# Patient Record
Sex: Female | Born: 1975
Health system: Southern US, Community
[De-identification: ages and names within clinical notes are randomized; demographics above are authoritative.]

## PROBLEM LIST (undated history)

## (undated) DIAGNOSIS — T7840XA Allergy, unspecified, initial encounter: Secondary | ICD-10-CM

## (undated) DIAGNOSIS — B20 Human immunodeficiency virus [HIV] disease: Secondary | ICD-10-CM

## (undated) DIAGNOSIS — D219 Benign neoplasm of connective and other soft tissue, unspecified: Secondary | ICD-10-CM

## (undated) DIAGNOSIS — Z21 Asymptomatic human immunodeficiency virus [HIV] infection status: Secondary | ICD-10-CM

## (undated) DIAGNOSIS — D649 Anemia, unspecified: Secondary | ICD-10-CM

## (undated) HISTORY — DX: Allergy, unspecified, initial encounter: T78.40XA

## (undated) HISTORY — DX: Anemia, unspecified: D64.9

---

## 2012-09-19 ENCOUNTER — Encounter (HOSPITAL_BASED_OUTPATIENT_CLINIC_OR_DEPARTMENT_OTHER): Payer: Self-pay | Admitting: *Deleted

## 2012-09-19 ENCOUNTER — Emergency Department (HOSPITAL_BASED_OUTPATIENT_CLINIC_OR_DEPARTMENT_OTHER): Payer: Self-pay

## 2012-09-19 ENCOUNTER — Emergency Department (HOSPITAL_BASED_OUTPATIENT_CLINIC_OR_DEPARTMENT_OTHER)
Admission: EM | Admit: 2012-09-19 | Discharge: 2012-09-19 | Disposition: A | Discharge status: Home / Self Care | Payer: Self-pay | Attending: Emergency Medicine | Admitting: Emergency Medicine

## 2012-09-19 DIAGNOSIS — M722 Plantar fascial fibromatosis: Secondary | ICD-10-CM | POA: Insufficient documentation

## 2012-09-19 DIAGNOSIS — M6281 Muscle weakness (generalized): Secondary | ICD-10-CM | POA: Insufficient documentation

## 2012-09-19 LAB — D-DIMER, QUANTITATIVE: D-Dimer, Quant: <0.27 ug/mL-FEU (ref 0.00–0.48)

## 2012-09-19 MED ORDER — IBUPROFEN 800 MG PO TABS: 800.0000 mg | ORAL_TABLET | Freq: Once | ORAL | Status: DC

## 2012-09-19 MED ORDER — IBUPROFEN 800 MG PO TABS: 800.0000 mg | ORAL_TABLET | Freq: Three times a day (TID) | ORAL | Status: DC

## 2012-09-19 NOTE — ED Provider Notes (Signed)
History     CSN: [id number]  Arrival date & time 09/19/12  1727   First MD Initiated Contact with Patient 09/19/12 1818      Chief Complaint  Patient presents with  . Leg Pain    (Consider location/radiation/quality/duration/timing/severity/associated sxs/prior treatment) Patient is a 36 y.o. female presenting with leg pain. The history is provided by the patient. No language interpreter was used.  Leg Pain  The incident occurred 2 days ago. The incident occurred at home. There was no injury mechanism. Pain location: right and left lower leg. The pain is at a severity of 5/10. The pain is moderate. The pain has been constant since onset. Associated symptoms include inability to bear weight and muscle weakness. She reports no foreign bodies present. The symptoms are aggravated by bearing weight. She has tried nothing for the symptoms.  Pt reports she has had pain in both legs for the past week. Pt reports she called her Md who told her to come in to be evaluated for blood clots in her legs.   Pt reports she has pain in both her feet.  Pt reports her arches have feel  History reviewed. No pertinent past medical history.  History reviewed. No pertinent past surgical history.  History reviewed. No pertinent family history.  History  Substance Use Topics  . Smoking status: Never Smoker   . Smokeless tobacco: Not on file  . Alcohol Use: No    OB History    Grav Para Term Preterm Abortions TAB SAB Ect Mult Living                  Review of Systems  Musculoskeletal: Negative for myalgias.  All other systems reviewed and are negative.    Allergies  Review of patient's allergies indicates no known allergies.  Home Medications  No current outpatient prescriptions on file.  BP 154/83  Pulse 112  Resp 16  Ht 5\' 4"  (1.626 m)  Wt 220 lb (99.791 kg)  BMI 37.76 kg/m2  SpO2 99%  LMP 09/05/2012  Physical Exam  Vitals reviewed. Constitutional: She is oriented to person,  place, and time. She appears well-developed and well-nourished.  Musculoskeletal: She exhibits tenderness.       Tender bilat feet,  Diffuse tenderness bilat legs up to posterior knee,  No swelling, nv and ns intact  Neurological: She is alert and oriented to person, place, and time. She has normal reflexes.  Skin: Skin is warm.  Psychiatric: She has a normal mood and affect.    ED Course  Procedures (including critical care time)   Labs Reviewed  D-DIMER, QUANTITATIVE   Dg Foot Complete Left  09/19/2012  *RADIOLOGY REPORT*  Clinical Data: Foot pain.  LEFT FOOT - COMPLETE 3+ VIEW  Comparison: None.  Findings: No evidence of fracture or dislocation.  No evidence of arthropathy.  Plantar calcaneal spur noted.  Soft tissues are unremarkable.  IMPRESSION:  1.  No acute findings. 2.  Plantar calcaneal spur.   Original Report Authenticated By: Myles Rosenthal, M.D.    Dg Foot Complete Right  09/19/2012  *RADIOLOGY REPORT*  Clinical Data: Right foot arch pain.  No known injury.  RIGHT FOOT COMPLETE - 3+ VIEW  Comparison: None.  Findings: Moderate sized inferior calcaneal spur and small posterior calcaneal spur.  Small amount of subarticular cystic change at the first MTP joint.  IMPRESSION:  1.  Calcaneal spurs. 2.  Minimal first MTP joint degenerative change.   Original Report Authenticated By: Viviann Spare  Azucena Kuba, M.D.      No diagnosis found.    MDM  ddimer negative,   Pt well's score is 1.   Bilat xrays shows heel spurs.    Pt advised to follow up with Podiatrist for evaluation,  Good supportive shoes Rx for ibuprofen       Lonia Skinner [place], [place] 09/19/12 2036

## 2012-09-19 NOTE — ED Notes (Signed)
Pt c/o bil leg pain x 2 day

## 2012-09-19 NOTE — ED Provider Notes (Signed)
Medical screening examination/treatment/procedure(s) were performed by non-physician practitioner and as supervising physician I was immediately available for consultation/collaboration.   Ebenezer Mccaskey B. Bernette Mayers, MD 09/19/12 2040

## 2016-08-02 ENCOUNTER — Emergency Department (HOSPITAL_BASED_OUTPATIENT_CLINIC_OR_DEPARTMENT_OTHER)
Admission: EM | Admit: 2016-08-02 | Discharge: 2016-08-02 | Disposition: A | Discharge status: Home / Self Care | Payer: Self-pay | Attending: Emergency Medicine | Admitting: Emergency Medicine

## 2016-08-02 ENCOUNTER — Encounter (HOSPITAL_BASED_OUTPATIENT_CLINIC_OR_DEPARTMENT_OTHER): Payer: Self-pay | Admitting: Emergency Medicine

## 2016-08-02 DIAGNOSIS — J0110 Acute frontal sinusitis, unspecified: Secondary | ICD-10-CM

## 2016-08-02 DIAGNOSIS — Z79899 Other long term (current) drug therapy: Secondary | ICD-10-CM | POA: Insufficient documentation

## 2016-08-02 DIAGNOSIS — B20 Human immunodeficiency virus [HIV] disease: Secondary | ICD-10-CM | POA: Insufficient documentation

## 2016-08-02 HISTORY — DX: Asymptomatic human immunodeficiency virus (hiv) infection status: Z21

## 2016-08-02 HISTORY — DX: Human immunodeficiency virus (HIV) disease: B20

## 2016-08-02 MED ORDER — ACETAMINOPHEN 500 MG PO TABS: 500.0000 mg | ORAL_TABLET | Freq: Four times a day (QID) | ORAL | 0 refills | Status: DC | PRN

## 2016-08-02 MED ORDER — DEXAMETHASONE 6 MG PO TABS: 10.0000 mg | ORAL_TABLET | Freq: Once | ORAL | Status: DC

## 2016-08-02 MED ORDER — CETIRIZINE HCL 10 MG PO TABS: 10.0000 mg | ORAL_TABLET | Freq: Every day | ORAL | 0 refills | Status: AC

## 2016-08-02 MED ORDER — IBUPROFEN 800 MG PO TABS
800.0000 mg | ORAL_TABLET | Freq: Once | ORAL | Status: AC
  Administered 2016-08-02: 800 mg via ORAL
  Filled 2016-08-02: qty 1

## 2016-08-02 NOTE — ED Provider Notes (Signed)
[facility] Provider Note   CSN: [id number] Arrival date & time: 08/02/16  1638 By signing my name below, I, Georgette Shell, attest that this documentation has been prepared under the direction and in the presence of Gloriann Loan, PA-C. Electronically Signed: Georgette Shell, ED Scribe. 08/02/16. 6:17 PM.  History   Chief Complaint Chief Complaint  Patient presents with  . Headache   HPI Comments: Pauli Klundt is a 40 y.o. female with h/o HIV who presents to the Emergency Department complaining of 6/10, pressurized headache radiating to her bilateral ears onset a couple days ago. Pt also has associated intermittent dizziness, mild intermittent non-productive cough, and watery eyes. She notes she has taken Claritin and Sudafed with minimal relief. Denies any recent head injury or trauma. Pt denies rhinorrhea, sneezing, numbness, paresthesias, nausea, vomiting, sore throat, fever, eye pain, double vision, blurry vision, or any other associated symptoms.   The history is provided by the patient. No language interpreter was used.    Past Medical History:  Diagnosis Date  . HIV (human immunodeficiency virus infection) ([place])     There are no active problems to display for this patient.   History reviewed. No pertinent surgical history.  OB History    No data available       Home Medications    Prior to Admission medications   Medication Sig Start Date End Date Taking? Authorizing Provider  lamiVUDine (EPIVIR) 150 MG tablet Take 150 mg by mouth 2 (two) times daily.   Yes Historical Provider, MD  nelfinavir (VIRACEPT) 625 MG tablet Take 1,250 mg by mouth 2 (two) times daily with a meal.   Yes Historical Provider, MD  tenofovir (VIREAD) 300 MG tablet Take 300 mg by mouth daily.   Yes Historical Provider, MD  acetaminophen (TYLENOL) 500 MG tablet Take 1 tablet (500 mg total) by mouth every 6 (six) hours as needed. 08/02/16   Gloriann Loan, PA-C  cetirizine (ZYRTEC) 10 MG tablet Take  1 tablet (10 mg total) by mouth daily. 08/02/16   Gloriann Loan, PA-C    Family History History reviewed. No pertinent family history.  Social History Social History  Substance Use Topics  . Smoking status: Never Smoker  . Smokeless tobacco: Never Used  . Alcohol use No     Allergies   Review of patient's allergies indicates no known allergies.   Review of Systems Review of Systems  Constitutional: Negative for fever.  HENT: Positive for congestion. Negative for rhinorrhea, sneezing and sore throat.   Eyes: Positive for discharge. Negative for pain and visual disturbance.  Respiratory: Positive for cough.   Gastrointestinal: Negative for nausea and vomiting.  Neurological: Positive for dizziness and headaches. Negative for numbness.  All other systems reviewed and are negative.    Physical Exam Updated Vital Signs BP 133/88 (BP Location: Left Arm)   Pulse 94   Temp 98.5 F (36.9 C) (Oral)   Resp 16   Ht 5\' 3"  (1.6 m)   Wt 99.3 kg   LMP 07/19/2016   SpO2 100%   BMI 38.79 kg/m   Physical Exam  Constitutional: She is oriented to person, place, and time. She appears well-developed and well-nourished. She is active.  Non-toxic appearance. She does not have a sickly appearance. She does not appear ill.  HENT:  Head: Normocephalic and atraumatic.  Right Ear: Tympanic membrane and external ear normal. Tympanic membrane is not erythematous and not bulging.  Left Ear: Tympanic membrane and external ear normal. Tympanic membrane  is not erythematous and not bulging.  Nose: No mucosal edema, rhinorrhea, nasal deformity or septal deviation. Right sinus exhibits frontal sinus tenderness. Right sinus exhibits no maxillary sinus tenderness. Left sinus exhibits frontal sinus tenderness. Left sinus exhibits no maxillary sinus tenderness.  Mouth/Throat: Uvula is midline, oropharynx is clear and moist and mucous membranes are normal. No trismus in the jaw. No uvula swelling. No  oropharyngeal exudate, posterior oropharyngeal edema, posterior oropharyngeal erythema or tonsillar abscesses.  Neck: Normal range of motion. Neck supple.  No nuchal rigidity.   Cardiovascular: Normal rate and regular rhythm.   Pulmonary/Chest: Effort normal and breath sounds normal. No respiratory distress. She has no wheezes. She has no rales.  Abdominal: Soft. Bowel sounds are normal. She exhibits no distension. There is no tenderness.  Musculoskeletal: Normal range of motion.  Lymphadenopathy:    She has no cervical adenopathy.  Neurological: She is alert and oriented to person, place, and time.  Mental Status:   AOx3.  Speech clear without dysarthria. Cranial Nerves:  I-not tested  II-PERRLA  III, IV, VI-EOMs intact  V-temporal and masseter strength intact  VII-symmetrical facial movements intact, no facial droop  VIII-hearing grossly intact bilaterally  IX, X-gag intact  XI-strength of sternomastoid and trapezius muscles 5/5  XII-tongue midline Motor:   Good muscle bulk and tone  Strength 5/5 bilaterally in upper and lower extremities   Cerebellar--intact RAMs, finger to nose intact bilaterally.  Gait normal  No pronator drift Sensory:  Intact in upper and lower extremities   Skin: Skin is warm and dry.  Psychiatric: She has a normal mood and affect. Her behavior is normal.     ED Treatments / Results  DIAGNOSTIC STUDIES: Oxygen Saturation is 100% on RA, normal by my interpretation.    COORDINATION OF CARE: 6:16 PM Discussed treatment plan with pt at bedside which includes Ibuprofen and Zyrtec and pt agreed to plan.  Labs (all labs ordered are listed, but only abnormal results are displayed) Labs Reviewed - No data to display  EKG  EKG Interpretation None       Radiology No results found.  Procedures Procedures (including critical care time)  Medications Ordered in ED Medications  ibuprofen (ADVIL,MOTRIN) tablet 800 mg (800 mg Oral Given 08/02/16  1834)     Initial Impression / Assessment and Plan / ED Course  I have reviewed the triage vital signs and the nursing notes.  Pertinent labs & imaging results that were available during my care of the patient were reviewed by me and considered in my medical decision making (see chart for details).  Clinical Course   Patient complaining of symptoms of sinusitis.    Mild to moderate symptoms of clear/yellow nasal discharge/congestion and scratchy throat with cough for less than 10 days.  Patient is afebrile.  Normal neurological exam. No concern for acute bacterial rhinosinusitis; likely viral in nature.  No concern for abscess, meningitis, or concerning neurologic etiology.  Patient discharged with symptomatic treatment.  Patient instructions given for warm saline nasal washes.  Recommendations for follow-up with primary care physician.     Final Clinical Impressions(s) / ED Diagnoses   Final diagnoses:  Acute non-recurrent frontal sinusitis    New Prescriptions New Prescriptions   ACETAMINOPHEN (TYLENOL) 500 MG TABLET    Take 1 tablet (500 mg total) by mouth every 6 (six) hours as needed.   CETIRIZINE (ZYRTEC) 10 MG TABLET    Take 1 tablet (10 mg total) by mouth daily.   I personally  performed the services described in this documentation, which was scribed in my presence. The recorded information has been reviewed and is accurate.     Gloriann Loan, PA-C 08/02/16 [place] Liu, MD 08/03/16 1322

## 2016-08-02 NOTE — ED Triage Notes (Signed)
Patient states that she has a lot of ear pressure and sometimes dizziness - the patient states that it is worse when she drives.

## 2016-08-24 ENCOUNTER — Encounter (HOSPITAL_BASED_OUTPATIENT_CLINIC_OR_DEPARTMENT_OTHER): Payer: Self-pay | Admitting: *Deleted

## 2016-08-24 ENCOUNTER — Emergency Department (HOSPITAL_BASED_OUTPATIENT_CLINIC_OR_DEPARTMENT_OTHER)
Admission: EM | Admit: 2016-08-24 | Discharge: 2016-08-24 | Disposition: A | Discharge status: Home / Self Care | Payer: Self-pay | Attending: Emergency Medicine | Admitting: Emergency Medicine

## 2016-08-24 DIAGNOSIS — H6690 Otitis media, unspecified, unspecified ear: Secondary | ICD-10-CM

## 2016-08-24 DIAGNOSIS — Z79899 Other long term (current) drug therapy: Secondary | ICD-10-CM | POA: Insufficient documentation

## 2016-08-24 DIAGNOSIS — H9202 Otalgia, left ear: Secondary | ICD-10-CM

## 2016-08-24 DIAGNOSIS — H6692 Otitis media, unspecified, left ear: Secondary | ICD-10-CM | POA: Insufficient documentation

## 2016-08-24 DIAGNOSIS — Z21 Asymptomatic human immunodeficiency virus [HIV] infection status: Secondary | ICD-10-CM | POA: Insufficient documentation

## 2016-08-24 DIAGNOSIS — Z79891 Long term (current) use of opiate analgesic: Secondary | ICD-10-CM | POA: Insufficient documentation

## 2016-08-24 MED ORDER — AMOXICILLIN 500 MG PO CAPS: 500.0000 mg | ORAL_CAPSULE | Freq: Three times a day (TID) | ORAL | 0 refills | Status: DC

## 2016-08-24 MED FILL — AMOXICILLIN 500 MG CAPSULE: 500 | 7 days supply | Qty: 21 | Fill #0

## 2016-08-24 NOTE — ED Triage Notes (Signed)
Pt reports continued left ear pain x 3 weeks. Now right ear also is hurting. C/o dizziness since Saturday. Has been taking zyrtec and tylenol

## 2016-08-24 NOTE — ED Provider Notes (Signed)
[facility] [facility] Provider Note   CSN: [id number] Arrival date & time: 08/24/16  [id number]     History   Chief Complaint Chief Complaint  Patient presents with  . Otalgia    HPI Melissa White is a 40 y.o. female.  HPI Patient presents to the emergency department with complaints of acute left ear pain over the past several days.  She's had pain there for several weeks but reports that it is worsening.  She had some dizziness yesterday described as a sensation of spinning.  She denies fevers and chills.  She reports the majority of her upper respiratory symptoms have resolved but her left ear pain has continued.  She denies dental pain.  No difficulty breathing or swallowing.     Past Medical History:  Diagnosis Date  . HIV (human immunodeficiency virus infection) ([place])     There are no active problems to display for this patient.   History reviewed. No pertinent surgical history.  OB History    No data available       Home Medications    Prior to Admission medications   Medication Sig Start Date End Date Taking? Authorizing Provider  acetaminophen (TYLENOL) 500 MG tablet Take 1 tablet (500 mg total) by mouth every 6 (six) hours as needed. 08/02/16  Yes Kayla Rose, PA-C  cetirizine (ZYRTEC) 10 MG tablet Take 1 tablet (10 mg total) by mouth daily. 08/02/16  Yes Kayla Rose, PA-C  lamiVUDine (EPIVIR) 150 MG tablet Take 150 mg by mouth 2 (two) times daily.   Yes Historical Provider, MD  nelfinavir (VIRACEPT) 625 MG tablet Take 1,250 mg by mouth 2 (two) times daily with a meal.   Yes Historical Provider, MD  tenofovir (VIREAD) 300 MG tablet Take 300 mg by mouth daily.   Yes Historical Provider, MD  amoxicillin (AMOXIL) 500 MG capsule Take 1 capsule (500 mg total) by mouth 3 (three) times daily. 08/24/16   Jola Schmidt, MD    Family History No family history on file.  Social History Social History  Substance Use Topics  . Smoking status: Never Smoker  . Smokeless  tobacco: Never Used  . Alcohol use No     Allergies   Patient has no known allergies.   Review of Systems Review of Systems  All other systems reviewed and are negative.    Physical Exam Updated Vital Signs BP 130/78 (BP Location: Left Arm)   Pulse 87   Temp 98.1 F (36.7 C) (Oral)   Resp 18   Ht 5\' 3"  (1.6 m)   Wt 220 lb (99.8 kg)   LMP 08/18/2016   SpO2 99%   BMI 38.97 kg/m   Physical Exam  Constitutional: She is oriented to person, place, and time. She appears well-developed and well-nourished.  HENT:  Head: Normocephalic.  Right TM normal.  Anterior neck normal.  Dentition normal.  Left TM with bulging and mild erythema  Eyes: EOM are normal.  Neck: Normal range of motion.  Pulmonary/Chest: Effort normal.  Abdominal: She exhibits no distension.  Musculoskeletal: Normal range of motion.  Neurological: She is alert and oriented to person, place, and time.  Psychiatric: She has a normal mood and affect.  Nursing note and vitals reviewed.    ED Treatments / Results  Labs (all labs ordered are listed, but only abnormal results are displayed) Labs Reviewed - No data to display  EKG  EKG Interpretation None       Radiology No results found.  Procedures  Procedures (including critical care time)  Medications Ordered in ED Medications - No data to display   Initial Impression / Assessment and Plan / ED Course  I have reviewed the triage vital signs and the nursing notes.  Pertinent labs & imaging results that were available during my care of the patient were reviewed by me and considered in my medical decision making (see chart for details).  Clinical Course     Patient be treated for developing left-sided acute otitis media.  Overall well-appearing.  Nontender mastoid.  Final Clinical Impressions(s) / ED Diagnoses   Final diagnoses:  Otalgia of left ear  Acute otitis media, unspecified otitis media type    New Prescriptions New  Prescriptions   AMOXICILLIN (AMOXIL) 500 MG CAPSULE    Take 1 capsule (500 mg total) by mouth 3 (three) times daily.     Jola Schmidt, MD 08/24/16 [phone]

## 2017-03-15 ENCOUNTER — Encounter (HOSPITAL_BASED_OUTPATIENT_CLINIC_OR_DEPARTMENT_OTHER): Payer: Self-pay | Admitting: *Deleted

## 2017-03-15 ENCOUNTER — Emergency Department (HOSPITAL_BASED_OUTPATIENT_CLINIC_OR_DEPARTMENT_OTHER)
Admission: EM | Admit: 2017-03-15 | Discharge: 2017-03-15 | Disposition: A | Discharge status: Home / Self Care | Payer: BLUE CROSS/BLUE SHIELD | Attending: Emergency Medicine | Admitting: Emergency Medicine

## 2017-03-15 DIAGNOSIS — R21 Rash and other nonspecific skin eruption: Secondary | ICD-10-CM | POA: Diagnosis not present

## 2017-03-15 DIAGNOSIS — L259 Unspecified contact dermatitis, unspecified cause: Secondary | ICD-10-CM | POA: Diagnosis not present

## 2017-03-15 DIAGNOSIS — Z79899 Other long term (current) drug therapy: Secondary | ICD-10-CM | POA: Insufficient documentation

## 2017-03-15 DIAGNOSIS — B20 Human immunodeficiency virus [HIV] disease: Secondary | ICD-10-CM | POA: Diagnosis not present

## 2017-03-15 MED ORDER — HYDROCORTISONE 1 % EX CREA: TOPICAL_CREAM | CUTANEOUS | 0 refills | Status: DC

## 2017-03-15 MED ORDER — DIPHENHYDRAMINE HCL 25 MG PO CAPS: 25.0000 mg | ORAL_CAPSULE | Freq: Four times a day (QID) | ORAL | 0 refills | Status: DC | PRN

## 2017-03-15 NOTE — ED Provider Notes (Signed)
[facility] Provider Note   CSN: [id number] Arrival date & time: 03/15/17  0354     History   Chief Complaint Chief Complaint  Patient presents with  . Rash    HPI Melissa White is a 41 y.o. female  With history of HIV wo presents today with chief complaint acute onset, progressively worsening pruritic rash of the face and neck which began 2 days ago. She states she noticed the rash on her neck initially and has spread to bilateral jawline radiating up to the forehead and behind the ears. She has tried Benadryl cream without relief. She states it is not painful, just itchy. She states that she started using a new Dove soap body wash recently, as well as a new hair spray, but denies any other new exposures. No modifying factors. Denies known insect bites. No one else around her has a similar rash. S she denies bleeding or drainage. he denies fever, chills, chest pain, shortness of breath, arthralgias, headaches. The history is provided by the patient.    Past Medical History:  Diagnosis Date  . HIV (human immunodeficiency virus infection) ([place])     There are no active problems to display for this patient.   History reviewed. No pertinent surgical history.  OB History    No data available       Home Medications    Prior to Admission medications   Medication Sig Start Date End Date Taking? Authorizing Provider  acetaminophen (TYLENOL) 500 MG tablet Take 1 tablet (500 mg total) by mouth every 6 (six) hours as needed. 08/02/16   Gloriann Loan, PA-C  cetirizine (ZYRTEC) 10 MG tablet Take 1 tablet (10 mg total) by mouth daily. 08/02/16   Gloriann Loan, PA-C  diphenhydrAMINE (BENADRYL) 25 mg capsule Take 1 capsule (25 mg total) by mouth every 6 (six) hours as needed for itching. 03/15/17   Nils Flack, Shaylinn Hladik A, PA-C  hydrocortisone cream 1 % Apply to affected area 2 times daily 03/15/17   Rodell Perna A, PA-C  lamiVUDine (EPIVIR) 150 MG tablet Take 150 mg by mouth 2 (two) times  daily.    [provider]  nelfinavir (VIRACEPT) 625 MG tablet Take 1,250 mg by mouth 2 (two) times daily with a meal.    [provider]  tenofovir (VIREAD) 300 MG tablet Take 300 mg by mouth daily.    [provider]    Family History History reviewed. No pertinent family history.  Social History Social History  Substance Use Topics  . Smoking status: Never Smoker  . Smokeless tobacco: Never Used  . Alcohol use No     Allergies   Patient has no known allergies.   Review of Systems Review of Systems  Constitutional: Negative for chills and fever.  Respiratory: Negative for shortness of breath.   Cardiovascular: Negative for chest pain.  Gastrointestinal: Negative for abdominal pain.  Musculoskeletal: Negative for arthralgias and myalgias.  Skin: Positive for rash.  Neurological: Negative for headaches.  All other systems reviewed and are negative.    Physical Exam Updated Vital Signs BP (!) 144/92 (BP Location: Left Arm)   Pulse 82   Temp 98.7 F (37.1 C) (Oral)   Resp 18   Ht 5\' 4"  (1.626 m)   Wt 100.2 kg (221 lb)   SpO2 100%   BMI 37.93 kg/m   Physical Exam  Constitutional: She appears well-developed and well-nourished.  HENT:  White: Normocephalic and atraumatic.  Right Ear: External ear normal.  Left  Ear: External ear normal.  Mouth/Throat: Oropharynx is clear and moist. No oropharyngeal exudate.  TMs normal bilaterally, posterior oropharynx clear. Airway patent.  Eyes: Conjunctivae are normal. Right eye exhibits no discharge. Left eye exhibits no discharge. No scleral icterus.  Neck: Normal range of motion. Neck supple. No JVD present. No tracheal deviation present.  Cardiovascular: Normal rate.   Pulmonary/Chest: Effort normal.  Abdominal: She exhibits no distension.  Musculoskeletal: She exhibits no edema.  Lymphadenopathy:    She has no cervical adenopathy.  Neurological: She is alert.  Skin: Skin is warm and dry. Rash  noted.  Multiple small (<63mm) raised mildly erythematous circular lesions to the neck radiating up to the jawline, cheeks, temples. No pustules, vesicles, bullae, tracts or desquamation.  Psychiatric: She has a normal mood and affect. Her behavior is normal.     ED Treatments / Results  Labs (all labs ordered are listed, but only abnormal results are displayed) Labs Reviewed - No data to display  EKG  EKG Interpretation None       Radiology No results found.  Procedures Procedures (including critical care time)  Medications Ordered in ED Medications - No data to display   Initial Impression / Assessment and Plan / ED Course  I have reviewed the triage vital signs and the nursing notes.  Pertinent labs & imaging results that were available during my care of the patient were reviewed by me and considered in my medical decision making (see chart for details).     Rash consistent with contact dermatitis from exposure to new soaps/hair product. Patient denies any difficulty breathing or swallowing.  Pt has a patent airway without stridor and is handling secretions without difficulty; no angioedema. No blisters, no pustules, no warmth, no draining sinus tracts, no superficial abscesses, no bullous impetigo, no vesicles, no desquamation, no target lesions with dusky purpura or a central bulla. Not tender to touch. No concern for superimposed infection. No concern for SJS, TEN, TSS, tick borne illness, syphilis or other life-threatening condition. Will discharge home with short course of low potency topical steroids and recommend Benadryl as needed for pruritis. She will follow up with her primary care or dermatology for reevaluation. Discussed indications for return to the ED. Pt verbalized understanding of and agreement with plan and is safe for discharge home at this time.  Final Clinical Impressions(s) / ED Diagnoses   Final diagnoses:  Rash    New Prescriptions New  Prescriptions   DIPHENHYDRAMINE (BENADRYL) 25 MG CAPSULE    Take 1 capsule (25 mg total) by mouth every 6 (six) hours as needed for itching.   HYDROCORTISONE CREAM 1 %    Apply to affected area 2 times daily     Debroah Baller 03/15/17 0955    Davonna Belling, MD 03/15/17 [phone]

## 2017-03-15 NOTE — ED Triage Notes (Signed)
Rash to face and neck x 1 day.  Itching.

## 2017-03-15 NOTE — Discharge Instructions (Signed)
Apply steroid cream to the neck twice daily, take Benadryl as needed for itching. Follow-up with your primary care or dermatology for reevaluation. Return to the ED if any concerning symptoms develop.

## 2017-05-24 DIAGNOSIS — Z124 Encounter for screening for malignant neoplasm of cervix: Secondary | ICD-10-CM | POA: Diagnosis not present

## 2017-05-24 DIAGNOSIS — Z0000 Encounter for general adult medical examination without abnormal findings: Secondary | ICD-10-CM | POA: Diagnosis not present

## 2017-05-24 DIAGNOSIS — R87618 Other abnormal cytological findings on specimens from cervix uteri: Secondary | ICD-10-CM | POA: Diagnosis not present

## 2017-05-24 DIAGNOSIS — Z21 Asymptomatic human immunodeficiency virus [HIV] infection status: Secondary | ICD-10-CM | POA: Diagnosis not present

## 2017-05-24 DIAGNOSIS — Z3009 Encounter for other general counseling and advice on contraception: Secondary | ICD-10-CM | POA: Diagnosis not present

## 2017-05-24 DIAGNOSIS — R102 Pelvic and perineal pain: Secondary | ICD-10-CM | POA: Diagnosis not present

## 2017-05-28 DIAGNOSIS — Z6839 Body mass index (BMI) 39.0-39.9, adult: Secondary | ICD-10-CM | POA: Diagnosis not present

## 2017-05-28 DIAGNOSIS — D509 Iron deficiency anemia, unspecified: Secondary | ICD-10-CM | POA: Diagnosis not present

## 2017-05-28 DIAGNOSIS — B20 Human immunodeficiency virus [HIV] disease: Secondary | ICD-10-CM | POA: Diagnosis not present

## 2017-05-28 DIAGNOSIS — E669 Obesity, unspecified: Secondary | ICD-10-CM | POA: Diagnosis not present

## 2017-06-15 NOTE — Progress Notes (Signed)
[place] at [facility] [place], [place], [place] [address] [phone]  Date:  06/16/2017   Name:  Milli Woolridge   DOB:  1976/08/08   MRN:  [id number]  PCP:  Darreld Mclean, MD    Chief Complaint: Establish Care (PATIENT IS HIV POSITIVE)   History of Present Illness:  Adelyn Roscher is a 41 y.o. 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 Her sons are 103, 29 and 51 yo, and she has a Geographical information systems officer who is 41 yo She is married  She stays busy working and spending time with her family She plans to get a mammogram soon- has not done yet  She is otherwise generally in good health   About 3 weeks ago she saw her GYN and had her pap  She does tend to have heavy menses  Her GYN gave her some ibuprofen for menstrual pains  She takes iron pills for anemia It seems that her pharmacist was worried about her using ibuprofen (per her GYN- 800 mg) with her HIV meds due to concern about nephrotoxicity so she did not get this filled.  She wonders if ok for her to use ibuprofen  She did have her cholesterol checked recently per her ID clinic  She reports that they also keep an eye on her blood sugar  Patient Active Problem List   Diagnosis Date Noted  . HIV positive ([place]) 06/16/2017  . Menstrual cramps 06/16/2017    Past Medical History:  Diagnosis Date  . Allergy   . Anemia   . HIV (human immunodeficiency virus infection) ([place])     Past Surgical History:  Procedure Laterality Date  . CESAREAN SECTION     2    Social History  Substance Use Topics  . Smoking  status: Never Smoker  . Smokeless tobacco: Never Used  . Alcohol use No    Family History  Problem Relation Age of Onset  . Diabetes Mother   . Arthritis Mother   . Hypertension Mother   . Kidney disease Father   . Hypertension Father     No Known Allergies  Medication list has been reviewed and updated.  Current Outpatient Prescriptions on File Prior to Visit  Medication Sig Dispense Refill  . acetaminophen (TYLENOL) 500 MG tablet Take 1 tablet (500 mg total) by mouth every 6 (six) hours as needed. 30 tablet 0  . cetirizine (ZYRTEC) 10 MG tablet Take 1 tablet (10 mg total) by mouth daily. 30 tablet 0  . diphenhydrAMINE (BENADRYL) 25 mg capsule Take 1 capsule (25 mg total) by mouth every 6 (six) hours as needed for itching. 30 capsule 0  . hydrocortisone cream 1 % Apply to affected area 2 times daily 15 g 0  . lamiVUDine (EPIVIR) 150 MG tablet Take 150 mg by mouth 2 (two) times daily.    Marland Kitchen nelfinavir (VIRACEPT) 625 MG tablet Take 1,250 mg by mouth 2 (two) times daily with a meal.    . tenofovir (VIREAD) 300 MG tablet Take 300 mg  by mouth daily.     No current facility-administered medications on file prior to visit.     Review of Systems:  As per HPI- otherwise negative. No fever, chills, nausea, vomiting, CP, SOB, ST, cough   Physical Examination: Vitals:   06/16/17 1318  BP: 126/80  Pulse: (!) 101  Temp: 98.3 F (36.8 C)  SpO2: 99%   Vitals:   06/16/17 1318  Weight: 224 lb 3.2 oz (101.7 kg)  Height: 5' 3.2" (1.605 m)   Body mass index is 39.46 kg/m. Ideal Body Weight: Weight in (lb) to have BMI = 25: 141.7  GEN: WDWN, NAD, Non-toxic, A & O x 3, obese, otherwise looks well HEENT: Atraumatic, Normocephalic. Neck supple. No masses, No LAD.  Bilateral TM wnl, oropharynx normal.  PEERL,EOMI.   Ears and Nose: No external deformity. CV: RRR, No M/G/R. No JVD. No thrill. No extra heart sounds. PULM: CTA B, no wheezes, crackles, rhonchi. No retractions. No resp.  distress. No accessory muscle use. EXTR: No c/c/e NEURO Normal gait.  PSYCH: Normally interactive. Conversant. Not depressed or anxious appearing.  Calm demeanor.    Assessment and Plan: Menstrual cramps  HIV positive ([place])  Checked 2 resources- eporcates has nothing, but UTD mentions risk of nephrotoxicity with high dose NSAIDs and Viread.  She will try lower dose otc ibuprofen for her cramps, will let me know if not helpful Otherwise she does not have any acute needs today  Signed Lamar Blinks, MD

## 2017-06-16 ENCOUNTER — Encounter: Payer: Self-pay | Admitting: Family Medicine

## 2017-06-16 ENCOUNTER — Ambulatory Visit (INDEPENDENT_AMBULATORY_CARE_PROVIDER_SITE_OTHER): Payer: BLUE CROSS/BLUE SHIELD | Admitting: Family Medicine

## 2017-06-16 VITALS — BP 126/80 | HR 101 | Temp 98.3°F | Ht 63.2 in | Wt 224.2 lb

## 2017-06-16 DIAGNOSIS — N946 Dysmenorrhea, unspecified: Secondary | ICD-10-CM | POA: Diagnosis not present

## 2017-06-16 DIAGNOSIS — Z21 Asymptomatic human immunodeficiency virus [HIV] infection status: Secondary | ICD-10-CM | POA: Insufficient documentation

## 2017-06-16 NOTE — Patient Instructions (Addendum)
Why don't you try 200 to 400 mg of ibuprefen as needed for your menstrual pains - the 800 mg might be risky in combination with viread.    It was very nice to see you today- take care and let me know if you need anything!

## 2017-11-09 DIAGNOSIS — Z79899 Other long term (current) drug therapy: Secondary | ICD-10-CM | POA: Diagnosis not present

## 2017-11-09 DIAGNOSIS — M545 Low back pain: Secondary | ICD-10-CM | POA: Diagnosis not present

## 2017-11-09 DIAGNOSIS — E669 Obesity, unspecified: Secondary | ICD-10-CM | POA: Diagnosis not present

## 2017-11-09 DIAGNOSIS — B20 Human immunodeficiency virus [HIV] disease: Secondary | ICD-10-CM | POA: Diagnosis not present

## 2017-12-17 DIAGNOSIS — B20 Human immunodeficiency virus [HIV] disease: Secondary | ICD-10-CM | POA: Diagnosis not present

## 2018-01-11 NOTE — Progress Notes (Signed)
[place] at [facility] [place], [place], [place] [address] [phone]  Date:  01/12/2018   Name:  Melissa White   DOB:  04-05-1976   MRN:  [id number]  PCP:  Darreld Mclean, MD    Chief Complaint: Shoulder Pain (c/o right shoulder pain that has been present for a couple of weeks )   History of Present Illness:  Melissa White is a 41 y.o. 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 Her sons are 74, 71 and 66 yo, and she has a Geographical information systems officer who is 42 yo She is married  She stays busy working and spending time with her family  She has noted right shoulder pain for a couple of weeks She is not really aware of any injury It is more painful when she first gets up in the morning  She is a CNA, 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  She did have a lot of problems with her ears as a child   She is now down to a single HIV pill as she was doing so well with her treatment- so far she is tolerating this quite well   Patient Active Problem List   Diagnosis Date Noted  . HIV positive ([place]) 06/16/2017  . Menstrual cramps 06/16/2017    Past Medical History:  Diagnosis Date  . Allergy   . Anemia   . HIV (human immunodeficiency virus infection) ([place])     Past  Surgical History:  Procedure Laterality Date  . CESAREAN SECTION     2    Social History   Tobacco Use  . Smoking status: Never Smoker  . Smokeless tobacco: Never Used  Substance Use Topics  . Alcohol use: No  . Drug use: No    Family History  Problem Relation Age of Onset  . Diabetes Mother   . Arthritis Mother   . Hypertension Mother   . Kidney disease Father   . Hypertension Father     No Known Allergies  Medication list has been reviewed and updated.  Current Outpatient Medications on File Prior to Visit  Medication Sig Dispense Refill  . BIKTARVY 50-200-25 MG TABS tablet TK 1 T PO  QD  5  . acetaminophen (TYLENOL) 500 MG tablet Take 1 tablet (500 mg total) by mouth every 6 (six) hours as needed. 30 tablet 0  . cetirizine (ZYRTEC) 10 MG tablet Take 1 tablet (10 mg total) by mouth daily. 30 tablet 0  . diphenhydrAMINE (BENADRYL) 25 mg capsule Take 1 capsule (25 mg total) by mouth every 6 (six) hours as needed for itching. 30 capsule 0  . hydrocortisone cream 1 % Apply to  affected area 2 times daily 15 g 0   No current facility-administered medications on file prior to visit.     Review of Systems:  As per HPI- otherwise negative.   Physical Examination: Vitals:   01/12/18 1001  BP: 132/82  Pulse: 82  Temp: 97.9 F (36.6 C)  SpO2: 97%   Vitals:   01/12/18 1001  Weight: 232 lb 12.8 oz (105.6 kg)  Height: 5\' 3"  (1.6 m)   Body mass index is 41.24 kg/m. Ideal Body Weight: Weight in (lb) to have BMI = 25: 140.8  GEN: WDWN, NAD, Non-toxic, A & O x 3, obese, looks well  HEENT: Atraumatic, Normocephalic. Neck supple. No masses, No LAD.  Bilateral TM wnl, oropharynx normal.  PEERL,EOMI.   Ears and Nose: No external deformity. CV: RRR, No M/G/R. No JVD. No thrill. No extra heart sounds. PULM: CTA B, no wheezes, crackles, rhonchi. No retractions. No resp. distress. No accessory muscle use. ABD: S, NT, ND, +BS. No rebound. No HSM. EXTR: No c/c/e NEURO  Normal gait.  PSYCH: Normally interactive. Conversant. Not depressed or anxious appearing.  Calm demeanor.  She has small external ears and narrow ear canal, small cavus.  However otherwise no abnl noted Right shoulder: full abduction, full flexion but she has pain at about 150 degrees. Normal external rotation, some pain with internal rotation Normal shoulder/ RUE strength to all testing performed, normal biceps DTR   Assessment and Plan: Right rotator cuff tendonitis  Drainage from right ear - Plan: acetic acid-hydrocortisone (VOSOL-HC) OTIC solution  Will have her try ear drops as above Suspect RC tendonitis but not tear.  Recommended that she do PT.  She would like to try home PT first, and will let me know if not helpful to her.  Gave her a hand-out and went over some exercises together for her   See patient instructions for more details.     Signed Lamar Blinks, MD

## 2018-01-12 ENCOUNTER — Ambulatory Visit (INDEPENDENT_AMBULATORY_CARE_PROVIDER_SITE_OTHER): Payer: BLUE CROSS/BLUE SHIELD | Admitting: Family Medicine

## 2018-01-12 ENCOUNTER — Encounter: Payer: Self-pay | Admitting: Family Medicine

## 2018-01-12 VITALS — BP 132/82 | HR 82 | Temp 97.9°F | Ht 63.0 in | Wt 232.8 lb

## 2018-01-12 DIAGNOSIS — H9211 Otorrhea, right ear: Secondary | ICD-10-CM

## 2018-01-12 DIAGNOSIS — M7581 Other shoulder lesions, right shoulder: Secondary | ICD-10-CM

## 2018-01-12 MED ORDER — HYDROCORTISONE-ACETIC ACID 1-2 % OT SOLN: 3.0000 [drp] | Freq: Three times a day (TID) | OTIC | 0 refills | Status: DC

## 2018-01-12 NOTE — Patient Instructions (Signed)
It seems that you have rotator cuff tendonitis in your right shoulder.  Try some of the exercises that we went over today. However, if you are not improved you may benefit from formal physical therapy.  Please let me know if not better/ well in about 2 weeks Try some of the ear drops for your right ear.  Please let me know if drainage is not resolved

## 2018-02-15 DIAGNOSIS — Z79899 Other long term (current) drug therapy: Secondary | ICD-10-CM | POA: Diagnosis not present

## 2018-02-15 DIAGNOSIS — M25511 Pain in right shoulder: Secondary | ICD-10-CM | POA: Diagnosis not present

## 2018-02-15 DIAGNOSIS — M25512 Pain in left shoulder: Secondary | ICD-10-CM | POA: Diagnosis not present

## 2018-02-15 DIAGNOSIS — B20 Human immunodeficiency virus [HIV] disease: Secondary | ICD-10-CM | POA: Diagnosis not present

## 2018-07-30 NOTE — Progress Notes (Addendum)
[place] at [facility] [address] [place], [place] [address] [phone]  Date:  08/01/2018   Name:  Melissa White   DOB:  June 23, 1976   MRN:  [id number]  PCP:  Darreld Mclean, MD    Chief Complaint: Leg Pain (several months, burning in both legs-worried about clotshas had plantar fasci)   History of Present Illness:  Melissa White is a 42 y.o. very pleasant female patient who presents with the following:  Here today with concern of leg pain History of HIV managed by ID Dr. Wynetta Emery with [facility]. However I don't think she has been seen in the last 6 months or so- she will be going in for follow-up soon  Flu: today Tetanus:  She thinks this is UTD per [facility], will check with them Routine labs- cholesterol, etc may also be due  She is being seen at [facility] next month and will have her HIV routine labs drawn. Will also get some labs for her today for her current sx and to check her CHL She notes a burning in both lower legs for about 2 months It is about the same in both legs Walking sometimes makes it worse- however not in a predictable way such as would be typical of claudication While laying down at night they are better Her feet hurt sometimes but this is not the same thing Her feet can be numb and tingly at times   She has never been dx with neuropathy She does not have DM per her knowledge She is on Biktarvy-  she had these sx prior to starting this med which is a combination of bictegravir emtricitabine tenofovir  However she was on other HIV meds which also may have caused neuropathy perhaps.  The history of her meds is not entirely clear to me   Patient Active Problem List   Diagnosis Date Noted  . HIV positive ([place]) 06/16/2017  . Menstrual cramps 06/16/2017    Past Medical History:  Diagnosis Date  . Allergy   . Anemia   . HIV (human immunodeficiency virus infection) ([place])     Past Surgical History:   Procedure Laterality Date  . CESAREAN SECTION     2    Social History   Tobacco Use  . Smoking status: Never Smoker  . Smokeless tobacco: Never Used  Substance Use Topics  . Alcohol use: No  . Drug use: No    Family History  Problem Relation Age of Onset  . Diabetes Mother   . Arthritis Mother   . Hypertension Mother   . Kidney disease Father   . Hypertension Father     No Known Allergies  Medication list has been reviewed and updated.  Current Outpatient Medications on File Prior to Visit  Medication Sig Dispense Refill  . acetaminophen (TYLENOL) 500 MG tablet Take 1 tablet (500 mg total) by mouth every 6 (six) hours as needed. 30 tablet 0  . acetic acid-hydrocortisone (VOSOL-HC) OTIC solution Place 3 drops into the right ear 3 (three) times daily. Use for one week as needed 10 mL 0  . BIKTARVY 50-200-25 MG TABS tablet TK 1 T PO  QD  5  . cetirizine (ZYRTEC) 10 MG tablet Take 1 tablet (10 mg total) by mouth daily. 30 tablet 0  . diphenhydrAMINE (BENADRYL) 25 mg capsule Take 1 capsule (25 mg total) by mouth every 6 (six) hours as needed for itching. 30 capsule 0  .  hydrocortisone cream 1 % Apply to affected area 2 times daily 15 g 0   No current facility-administered medications on file prior to visit.     Review of Systems:  As per HPI- otherwise negative. No history of DVT or PE No SOB   Physical Examination: Vitals:   08/01/18 1431  BP: 140/82  Pulse: 96  Resp: 18  SpO2: 97%   Vitals:   08/01/18 1431  Weight: 233 lb (105.7 kg)  Height: 5\' 3"  (1.6 m)   Body mass index is 41.27 kg/m. Ideal Body Weight: Weight in (lb) to have BMI = 25: 140.8  GEN: WDWN, NAD, Non-toxic, A & O x 3 HEENT: Atraumatic, Normocephalic. Neck supple. No masses, No LAD. Ears and Nose: No external deformity. CV: RRR, No M/G/R. No JVD. No thrill. No extra heart sounds. PULM: CTA B, no wheezes, crackles, rhonchi. No retractions. No resp. distress. No accessory muscle  use. EXTR: No c/c/e NEURO Normal gait.  PSYCH: Normally interactive. Conversant. Not depressed or anxious appearing.  Calm demeanor.  Excellent DP pulses bilaterally  Foot exam normal, can sense monofilament at all sites testes No skin breakdown Calves are soft and non- tender bilaterally No edema of her legs   Assessment and Plan: HIV positive ([place])  Screening for hyperlipidemia - Plan: Lipid panel  Screening for deficiency anemia  Screening for diabetes mellitus - Plan: Comprehensive metabolic panel, Hemoglobin A1c  Needs flu shot - Plan: Flu Vaccine QUAD 36+ mos IM  Neuropathy  Here today with legs pains suggestive of neuropathy.  Perhaps caused by her HIV meds Will obtain labs as above Assuming lytes ok plan for neurology consultation to confirm dx  She will NOT stop her HIV meds without consulting with her ID docs, may need to change her regimen   Signed Lamar Blinks, MD  Received her labs 10/15-  Message to pt   Your metabolic profile is ok.  Your potassium is minimally low, but I don't think this would cause pain in your legs. You might want to increase your dietary potassium however- you can find a good list of higher potassium foods online.        A1c is normal- no sign of diabetes here! Cholesterol is overall ok.  I went ahead and placed a referral to neurology for you to discuss your leg symptoms.  Take care!  Let me know if any worsening or change in your symptoms JC        Results for orders placed or performed in visit on 08/01/18  Comprehensive metabolic panel  Result Value Ref Range   Sodium 138 135 - 145 mEq/L   Potassium 3.4 (L) 3.5 - 5.1 mEq/L   Chloride 104 96 - 112 mEq/L   CO2 28 19 - 32 mEq/L   Glucose, Bld 111 (H) 70 - 99 mg/dL   BUN 9 6 - 23 mg/dL   Creatinine, Ser 0.69 0.40 - 1.20 mg/dL   Total Bilirubin 0.2 0.2 - 1.2 mg/dL   Alkaline Phosphatase 58 39 - 117 U/L   AST 13 0 - 37 U/L   ALT 16 0 - 35 U/L   Total Protein 7.2 6.0 - 8.3 g/dL    Albumin 3.9 3.5 - 5.2 g/dL   Calcium 9.1 8.4 - 10.5 mg/dL   GFR 120.05 >60.00 mL/min  Hemoglobin A1c  Result Value Ref Range   Hgb A1c MFr Bld 4.9 4.6 - 6.5 %  Lipid panel  Result Value Ref Range  Cholesterol 200 0 - 200 mg/dL   Triglycerides 196.0 (H) 0.0 - 149.0 mg/dL   HDL 55.60 >39.00 mg/dL   VLDL 39.2 0.0 - 40.0 mg/dL   LDL Cholesterol 105 (H) 0 - 99 mg/dL   Total CHOL/HDL Ratio 4    NonHDL 144.46

## 2018-08-01 ENCOUNTER — Encounter: Payer: Self-pay | Admitting: Family Medicine

## 2018-08-01 ENCOUNTER — Ambulatory Visit (INDEPENDENT_AMBULATORY_CARE_PROVIDER_SITE_OTHER): Payer: BLUE CROSS/BLUE SHIELD | Admitting: Family Medicine

## 2018-08-01 VITALS — BP 140/82 | HR 96 | Resp 18 | Ht 63.0 in | Wt 233.0 lb

## 2018-08-01 DIAGNOSIS — Z130 Encounter for screening for diseases of the blood and blood-forming organs and certain disorders involving the immune mechanism: Secondary | ICD-10-CM | POA: Diagnosis not present

## 2018-08-01 DIAGNOSIS — Z131 Encounter for screening for diabetes mellitus: Secondary | ICD-10-CM

## 2018-08-01 DIAGNOSIS — Z23 Encounter for immunization: Secondary | ICD-10-CM | POA: Diagnosis not present

## 2018-08-01 DIAGNOSIS — G629 Polyneuropathy, unspecified: Secondary | ICD-10-CM | POA: Diagnosis not present

## 2018-08-01 DIAGNOSIS — Z21 Asymptomatic human immunodeficiency virus [HIV] infection status: Secondary | ICD-10-CM

## 2018-08-01 DIAGNOSIS — Z13220 Encounter for screening for lipoid disorders: Secondary | ICD-10-CM | POA: Diagnosis not present

## 2018-08-01 NOTE — Patient Instructions (Signed)
I suspect that you have neuropathy (nerve sending a false pain signal) in your legs. This may be due to your HIV meds- however do NOT stop these without consulting with your HIV doctors. You may need to change to a different regimen.  I will get labs for you today and plan for a neurology consultation to confirm neuropathy.

## 2018-08-02 ENCOUNTER — Encounter: Payer: Self-pay | Admitting: Family Medicine

## 2018-08-02 LAB — COMPREHENSIVE METABOLIC PANEL
ALBUMIN: 3.9 g/dL (ref 3.5–5.2)
ALK PHOS: 58 U/L (ref 39–117)
ALT: 16 U/L (ref 0–35)
AST: 13 U/L (ref 0–37)
BUN: 9 mg/dL (ref 6–23)
CO2: 28 mEq/L (ref 19–32)
CREATININE: 0.69 mg/dL (ref 0.40–1.20)
Calcium: 9.1 mg/dL (ref 8.4–10.5)
Chloride: 104 mEq/L (ref 96–112)
GFR: 120.05 mL/min (ref >60.00)
GLUCOSE: 111 mg/dL — AB (ref 70–99)
POTASSIUM: 3.4 meq/L — AB (ref 3.5–5.1)
SODIUM: 138 meq/L (ref 135–145)
TOTAL PROTEIN: 7.2 g/dL (ref 6.0–8.3)
Total Bilirubin: 0.2 mg/dL (ref 0.2–1.2)

## 2018-08-02 LAB — LIPID PANEL
CHOL/HDL RATIO: 4
Cholesterol: 200 mg/dL (ref 0–200)
HDL: 55.6 mg/dL (ref >39.00)
LDL CALC: 105 mg/dL — AB (ref 0–99)
NONHDL: 144.46
TRIGLYCERIDES: 196 mg/dL — AB (ref 0.0–149.0)
VLDL: 39.2 mg/dL (ref 0.0–40.0)

## 2018-08-02 LAB — HEMOGLOBIN A1C: Hgb A1c MFr Bld: 4.9 % (ref 4.6–6.5)

## 2018-10-03 ENCOUNTER — Ambulatory Visit: Payer: BLUE CROSS/BLUE SHIELD | Admitting: Neurology

## 2018-10-03 ENCOUNTER — Telehealth: Payer: Self-pay | Admitting: *Deleted

## 2018-10-03 NOTE — Telephone Encounter (Signed)
No showed new patient appointment. 

## 2018-10-04 ENCOUNTER — Encounter: Payer: Self-pay | Admitting: Neurology

## 2018-11-11 ENCOUNTER — Other Ambulatory Visit: Payer: Self-pay

## 2018-11-11 ENCOUNTER — Emergency Department (HOSPITAL_BASED_OUTPATIENT_CLINIC_OR_DEPARTMENT_OTHER): Payer: Self-pay

## 2018-11-11 ENCOUNTER — Encounter (HOSPITAL_BASED_OUTPATIENT_CLINIC_OR_DEPARTMENT_OTHER): Payer: Self-pay | Admitting: Emergency Medicine

## 2018-11-11 ENCOUNTER — Emergency Department (HOSPITAL_BASED_OUTPATIENT_CLINIC_OR_DEPARTMENT_OTHER)
Admission: EM | Admit: 2018-11-11 | Discharge: 2018-11-11 | Disposition: A | Discharge status: Home / Self Care | Payer: Self-pay | Attending: Emergency Medicine | Admitting: Emergency Medicine

## 2018-11-11 DIAGNOSIS — X58XXXA Exposure to other specified factors, initial encounter: Secondary | ICD-10-CM | POA: Insufficient documentation

## 2018-11-11 DIAGNOSIS — Y9389 Activity, other specified: Secondary | ICD-10-CM | POA: Insufficient documentation

## 2018-11-11 DIAGNOSIS — R0789 Other chest pain: Secondary | ICD-10-CM | POA: Insufficient documentation

## 2018-11-11 DIAGNOSIS — Y998 Other external cause status: Secondary | ICD-10-CM | POA: Insufficient documentation

## 2018-11-11 DIAGNOSIS — Z79899 Other long term (current) drug therapy: Secondary | ICD-10-CM | POA: Insufficient documentation

## 2018-11-11 DIAGNOSIS — Z21 Asymptomatic human immunodeficiency virus [HIV] infection status: Secondary | ICD-10-CM | POA: Insufficient documentation

## 2018-11-11 DIAGNOSIS — Y929 Unspecified place or not applicable: Secondary | ICD-10-CM | POA: Insufficient documentation

## 2018-11-11 DIAGNOSIS — S46812A Strain of other muscles, fascia and tendons at shoulder and upper arm level, left arm, initial encounter: Secondary | ICD-10-CM | POA: Insufficient documentation

## 2018-11-11 LAB — CBC WITH DIFFERENTIAL/PLATELET
Abs Immature Granulocytes: 0.01 10*3/uL (ref 0.00–0.07)
Basophils Absolute: 0 10*3/uL (ref 0.0–0.1)
Basophils Relative: 1 %
EOS ABS: 0.1 10*3/uL (ref 0.0–0.5)
Eosinophils Relative: 2 %
HCT: 32.3 % — ABNORMAL LOW (ref 36.0–46.0)
Hemoglobin: 10.6 g/dL — ABNORMAL LOW (ref 12.0–15.0)
IMMATURE GRANULOCYTES: 0 %
Lymphocytes Relative: 38 %
Lymphs Abs: 1.4 10*3/uL (ref 0.7–4.0)
MCH: 26.5 pg (ref 26.0–34.0)
MCHC: 32.8 g/dL (ref 30.0–36.0)
MCV: 80.8 fL (ref 80.0–100.0)
MONOS PCT: 8 %
Monocytes Absolute: 0.3 10*3/uL (ref 0.1–1.0)
NEUTROS PCT: 51 %
NRBC: 0 % (ref 0.0–0.2)
Neutro Abs: 1.9 10*3/uL (ref 1.7–7.7)
Platelets: 304 10*3/uL (ref 150–400)
RBC: 4 MIL/uL (ref 3.87–5.11)
RDW: 15.2 % (ref 11.5–15.5)
WBC: 3.7 10*3/uL — ABNORMAL LOW (ref 4.0–10.5)

## 2018-11-11 LAB — BASIC METABOLIC PANEL
ANION GAP: 5 (ref 5–15)
BUN: 11 mg/dL (ref 6–20)
CALCIUM: 8.6 mg/dL — AB (ref 8.9–10.3)
CO2: 25 mmol/L (ref 22–32)
Chloride: 106 mmol/L (ref 98–111)
Creatinine, Ser: 0.65 mg/dL (ref 0.44–1.00)
GFR calc Af Amer: >60 mL/min (ref >60)
GLUCOSE: 95 mg/dL (ref 70–99)
Potassium: 3.6 mmol/L (ref 3.5–5.1)
Sodium: 136 mmol/L (ref 135–145)

## 2018-11-11 LAB — TROPONIN I: Troponin I: <0.03 ng/mL (ref <0.03)

## 2018-11-11 MED ORDER — ALUM & MAG HYDROXIDE-SIMETH 200-200-20 MG/5ML PO SUSP
15.0000 mL | Freq: Once | ORAL | Status: AC
  Administered 2018-11-11: 15 mL via ORAL
  Filled 2018-11-11: qty 30

## 2018-11-11 NOTE — ED Triage Notes (Signed)
Upper chest pain for "a few days". Left shoulder pain since yesterday, Pain now in left shoulder blade and going down left back.

## 2018-11-11 NOTE — Discharge Instructions (Signed)
Try zantac or pepcid twice a day.  Try to avoid things that may make this worse, most commonly these are spicy foods tomato based products fatty foods chocolate and peppermint.  Alcohol and tobacco can also make this worse.  Return to the emergency department for sudden worsening pain fever or inability to eat or drink.  Take 2 over the counter ibuprofen tablets 3 times a day or 1 over-the-counter naproxen tablets twice a day for pain. Also take tylenol 1000mg (2 extra strength) four times a day.   The ibuprofen or naproxen tablets can upset your stomach so please take this medication with food.  Return for worsening chest pain shortness of breath especially if you are trying to exercise or if you are going upstairs.

## 2018-11-11 NOTE — ED Notes (Signed)
Pt on monitor 

## 2018-11-11 NOTE — ED Notes (Signed)
Patient transported to X-ray 

## 2018-11-11 NOTE — ED Provider Notes (Signed)
[facility] EMERGENCY DEPARTMENT Provider Note   CSN: [id number] Arrival date & time: 11/11/18  0732     History   Chief Complaint Chief Complaint  Patient presents with  . Left upper body pain    HPI Melissa White is a 43 y.o. female.  43 yo F with a chief complaint of left-sided back pain.  This been going on for the past couple days.  Pain is along her shoulder blade and radiates up to her head.  Worse with movement twisting palpation.  Also has been complaining of chest pressure.  Feels like a fullness at the top of the chest.  This is been going on for about a week.  She thinks most likely this is reflux disease.  Tried some Tums without improvement.  Has some shortness of breath on exertion though feels this is somewhat chronic for her.  She denies hemoptysis unilateral lower extremity edema history of PE or DVT estrogen use.  Denies recent surgery or immobilization.  Patient denies history of MI.  Denies family history of MI.  Denies history of hypertension hyperlipidemia or smoking.  Patient does have a history of HIV and has been treated since the 90s.  The history is provided by the patient.  Chest Pain  Pain location:  Substernal area Pain quality: pressure   Pain radiates to:  Does not radiate Pain severity:  Moderate Onset quality:  Gradual Duration:  1 week Timing:  Constant Progression:  Unchanged Chronicity:  New Relieved by:  Nothing Worsened by:  Nothing Ineffective treatments:  None tried Associated symptoms: back pain   Associated symptoms: no dizziness, no fever, no headache, no nausea, no palpitations, no shortness of breath and no vomiting   Risk factors: obesity   Risk factors: no birth control, no coronary artery disease, no diabetes mellitus, no high cholesterol, no hypertension, not pregnant, no prior DVT/PE and no smoking     Past Medical History:  Diagnosis Date  . Allergy   . Anemia   . HIV (human immunodeficiency virus infection)  [facility])     Patient Active Problem List   Diagnosis Date Noted  . HIV positive ([place]) 06/16/2017  . Menstrual cramps 06/16/2017    Past Surgical History:  Procedure Laterality Date  . CESAREAN SECTION     2     OB History   No obstetric history on file.      Home Medications    Prior to Admission medications   Medication Sig Start Date End Date Taking? Authorizing Provider  valACYclovir (VALTREX) 500 MG tablet TAKE 1 TABLET BY MOUTH DAILY 09/13/18  Yes [provider]  BIKTARVY 50-200-25 MG TABS tablet TK 1 T PO  QD 12/17/17   [provider]  cetirizine (ZYRTEC) 10 MG tablet Take 1 tablet (10 mg total) by mouth daily. 08/02/16   Gloriann Loan, PA-C  hydrocortisone cream 1 % Apply to affected area 2 times daily 03/15/17   Rodell Perna A, PA-C  tranexamic acid (LYSTEDA) 650 MG TABS tablet tranexamic acid 650 mg tablet    [provider]    Family History Family History  Problem Relation Age of Onset  . Diabetes Mother   . Arthritis Mother   . Hypertension Mother   . Kidney disease Father   . Hypertension Father     Social History Social History   Tobacco Use  . Smoking status: Never Smoker  . Smokeless tobacco: Never Used  Substance Use Topics  . Alcohol use:  No  . Drug use: No     Allergies   Patient has no known allergies.   Review of Systems Review of Systems  Constitutional: Negative for chills and fever.  HENT: Negative for congestion and rhinorrhea.   Eyes: Negative for redness and visual disturbance.  Respiratory: Negative for shortness of breath and wheezing.   Cardiovascular: Positive for chest pain. Negative for palpitations.  Gastrointestinal: Negative for nausea and vomiting.  Genitourinary: Negative for dysuria and urgency.  Musculoskeletal: Positive for back pain. Negative for arthralgias and myalgias.  Skin: Negative for pallor and wound.  Neurological: Negative for dizziness and headaches.     Physical  Exam Updated Vital Signs BP (!) 141/75 (BP Location: Right Arm)   Pulse 82   Temp 98 F (36.7 C) (Oral)   Resp 16   Ht 5\' 3"  (1.6 m)   Wt 104.8 kg   LMP 11/03/2018   SpO2 100%   BMI 40.92 kg/m   Physical Exam Vitals signs and nursing note reviewed.  Constitutional:      General: She is not in acute distress.    Appearance: She is well-developed. She is not diaphoretic.  HENT:     Head: Normocephalic and atraumatic.  Eyes:     Pupils: Pupils are equal, round, and reactive to light.  Neck:     Musculoskeletal: Normal range of motion and neck supple.  Cardiovascular:     Rate and Rhythm: Normal rate and regular rhythm.     Heart sounds: No murmur. No friction rub. No gallop.   Pulmonary:     Effort: Pulmonary effort is normal.     Breath sounds: No wheezing or rales.  Abdominal:     General: There is no distension.     Palpations: Abdomen is soft.     Tenderness: There is no abdominal tenderness.  Musculoskeletal:        General: No tenderness.       Back:  Skin:    General: Skin is warm and dry.  Neurological:     Mental Status: She is alert and oriented to person, place, and time.  Psychiatric:        Behavior: Behavior normal.      ED Treatments / Results  Labs (all labs ordered are listed, but only abnormal results are displayed) Labs Reviewed  CBC WITH DIFFERENTIAL/PLATELET - Abnormal; Notable for the following components:      Result Value   WBC 3.7 (*)    Hemoglobin 10.6 (*)    HCT 32.3 (*)    All other components within normal limits  BASIC METABOLIC PANEL - Abnormal; Notable for the following components:   Calcium 8.6 (*)    All other components within normal limits  TROPONIN I    EKG EKG Interpretation  Date/Time:  Friday November 11 2018 07:49:22 EST Ventricular Rate:  78 PR Interval:    QRS Duration: 117 QT Interval:  423 QTC Calculation: 479 R Axis:   53 Text Interpretation:  Sinus rhythm Nonspecific intraventricular conduction delay  Low voltage, extremity and precordial leads No old tracing to compare Confirmed by Deno Etienne [phone]) on 11/11/2018 8:05:49 AM   Radiology Dg Chest 2 View  Result Date: 11/11/2018 CLINICAL DATA:  Chest pain EXAM: CHEST - 2 VIEW COMPARISON:  None. FINDINGS: The heart size and mediastinal contours are within normal limits. Both lungs are clear. The visualized skeletal structures are unremarkable. IMPRESSION: No active cardiopulmonary disease. Electronically Signed   By: Jerilynn Mages.  Shick  M.D.   On: 11/11/2018 08:22    Procedures Procedures (including critical care time)  Medications Ordered in ED Medications  alum & mag hydroxide-simeth (MAALOX/MYLANTA) 200-200-20 MG/5ML suspension 15 mL (15 mLs Oral Given 11/11/18 0827)     Initial Impression / Assessment and Plan / ED Course  I have reviewed the triage vital signs and the nursing notes.  Pertinent labs & imaging results that were available during my care of the patient were reviewed by me and considered in my medical decision making (see chart for details).     43 yo F with 2 separate complaints.  She is complaining of a chest pressure this been going on for a week.  No significant change over the past week, will obtain an EKG troponin and chest x-ray.  Do not feel the troponin needs to be repeated.  Patient is also complaining of left-sided back pain.  Seems musculoskeletal in nature.  Reproduced on exam.  Will treat supportively.  Work-up here with anemia at baseline.  No concerning electrolyte abnormality.  Chest x-ray viewed by me without focal infiltrate or pneumothorax.  Troponin negative.  Discharge home.  9:20 AM:  I have discussed the diagnosis/risks/treatment options with the patient and believe the pt to be eligible for discharge home to follow-up with PCP. We also discussed returning to the ED immediately if new or worsening sx occur. We discussed the sx which are most concerning (e.g., sudden worsening pain, fever, inability to  tolerate by mouth) that necessitate immediate return. Medications administered to the patient during their visit and any new prescriptions provided to the patient are listed below.  Medications given during this visit Medications  alum & mag hydroxide-simeth (MAALOX/MYLANTA) 200-200-20 MG/5ML suspension 15 mL (15 mLs Oral Given 11/11/18 0827)     The patient appears reasonably screen and/or stabilized for discharge and I doubt any other medical condition or other [facility] requiring further screening, evaluation, or treatment in the ED at this time prior to discharge.    Final Clinical Impressions(s) / ED Diagnoses   Final diagnoses:  Atypical chest pain  Trapezius strain, left, initial encounter    ED Discharge Orders    None       Deno Etienne, DO 11/11/18 0920

## 2018-11-21 DIAGNOSIS — N920 Excessive and frequent menstruation with regular cycle: Secondary | ICD-10-CM | POA: Diagnosis not present

## 2018-11-21 DIAGNOSIS — N858 Other specified noninflammatory disorders of uterus: Secondary | ICD-10-CM | POA: Diagnosis not present

## 2018-11-21 DIAGNOSIS — N946 Dysmenorrhea, unspecified: Secondary | ICD-10-CM | POA: Diagnosis not present

## 2018-12-14 ENCOUNTER — Other Ambulatory Visit: Payer: Self-pay | Admitting: Specialist

## 2018-12-14 DIAGNOSIS — D250 Submucous leiomyoma of uterus: Secondary | ICD-10-CM

## 2018-12-16 DIAGNOSIS — M62830 Muscle spasm of back: Secondary | ICD-10-CM | POA: Diagnosis not present

## 2018-12-16 DIAGNOSIS — N946 Dysmenorrhea, unspecified: Secondary | ICD-10-CM | POA: Diagnosis not present

## 2018-12-16 DIAGNOSIS — M545 Low back pain: Secondary | ICD-10-CM | POA: Diagnosis not present

## 2018-12-16 DIAGNOSIS — M5432 Sciatica, left side: Secondary | ICD-10-CM | POA: Diagnosis not present

## 2018-12-21 ENCOUNTER — Other Ambulatory Visit: Payer: Self-pay | Admitting: *Deleted

## 2018-12-21 ENCOUNTER — Ambulatory Visit
Admission: RE | Admit: 2018-12-21 | Discharge: 2018-12-21 | Disposition: A | Discharge status: Home / Self Care | Payer: Medicaid Other | Source: Ambulatory Visit | Attending: Specialist | Admitting: Specialist

## 2018-12-21 ENCOUNTER — Other Ambulatory Visit: Payer: Self-pay | Admitting: Interventional Radiology

## 2018-12-21 DIAGNOSIS — D259 Leiomyoma of uterus, unspecified: Secondary | ICD-10-CM | POA: Diagnosis not present

## 2018-12-21 DIAGNOSIS — N921 Excessive and frequent menstruation with irregular cycle: Secondary | ICD-10-CM | POA: Diagnosis not present

## 2018-12-21 DIAGNOSIS — D250 Submucous leiomyoma of uterus: Secondary | ICD-10-CM

## 2018-12-21 HISTORY — DX: Benign neoplasm of connective and other soft tissue, unspecified: D21.9

## 2018-12-21 HISTORY — PX: IR RADIOLOGIST EVAL & MGMT: IMG5224

## 2018-12-21 NOTE — Consult Note (Signed)
Chief Complaint: Patient was seen in consultation today for  Chief Complaint  Patient presents with  . Consult    Consult for [place]     at the request of Dorn,Henry H  Referring Physician(s): Dorn,Henry H  History of Present Illness: Melissa White is a 43 y.o. female who presents for consultation regarding uterine fibroid treatment options.  She was diagnosed with uterine fibroids approximately 2 years ago, on ultrasound.  She is complaining primarily of bleeding symptoms, menorrhagia, and some dysmenorrhea.  Her menstrual cycles are monthly, lasting 5 days, with 2 days of heavy bleeding with clots, requiring changing both pads and tampons every hour.  She does have some interperiod  Bleeding.  The only bulk symptoms described, bloating.  She has had no previous fibroid therapies or surgery.  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  Pap smear 05/25/2017 was negative.  Endometrial biopsy 11/21/2018 was nondiagnostic.  She is G3, P3, post 2 C-sections, with no plans for future pregnancy.  She is experiencing no perimenopausal symptoms.  Past Medical History:  Diagnosis Date  . Allergy   . Anemia   . Fibroids   . HIV (human immunodeficiency virus infection) ([place])     Past Surgical History:  Procedure Laterality Date  . CESAREAN SECTION     2    Allergies: Patient has no known allergies.  Medications: Prior to Admission medications   Medication Sig Start Date End Date Taking? Authorizing Provider  BIKTARVY 50-200-25 MG TABS tablet TK 1 T PO  QD 12/17/17  Yes [provider]  cetirizine (ZYRTEC) 10 MG tablet Take 1 tablet (10 mg total) by mouth daily. 08/02/16  Yes Gloriann Loan, PA-C  cyclobenzaprine (FLEXERIL) 10 MG tablet Take 10 mg by mouth 3 (three) times daily as needed for muscle spasms.   Yes [provider]  ferrous sulfate 325 (65 FE) MG tablet Take 325 mg by mouth daily with breakfast.   Yes [provider]  hydrocortisone cream 1 % Apply to affected area 2 times daily 03/15/17  Yes Fawze, Mina A, PA-C  naproxen (NAPROSYN) 500 MG tablet Take 500 mg by mouth 2 (two) times daily with a meal.   Yes [provider]  valACYclovir (VALTREX) 500 MG tablet TAKE 1 TABLET BY MOUTH DAILY 09/13/18  Yes [provider]  tranexamic acid (LYSTEDA) 650 MG TABS tablet tranexamic acid 650 mg tablet    [provider]     Family History  Problem Relation Age of Onset  . Diabetes Mother   . Arthritis Mother   . Hypertension Mother   . Kidney disease Father   . Hypertension Father     Social History   Socioeconomic History  . Marital status: Married    Spouse name: Not on file  . Number of children: Not on file  . Years of education: Not on file  . Highest education level: Not on file  Occupational History  . Not on file  Social Needs  . Financial resource strain: Not on file  . Food insecurity:    Worry: Not on file    Inability: Not on file  . Transportation needs:    Medical: Not on file    Non-medical: Not on file  Tobacco Use  . Smoking status: Never Smoker  . Smokeless tobacco: Never Used  Substance and Sexual Activity  . Alcohol use: No  . Drug use: No  . Sexual activity: Not Currently  Birth control/protection: Condom  Lifestyle  . Physical activity:    Days per week: Not on file    Minutes per session: Not on file  . Stress: Not on file  Relationships  . Social connections:    Talks on phone: Not on file    Gets together: Not on file    Attends religious service: Not on file    Active member of club or organization: Not on file    Attends meetings of clubs or organizations: Not on file    Relationship status: Not on file  Other Topics Concern  . Not on file  Social History Narrative  . Not on file    ECOG Status: 1 - Symptomatic but completely ambulatory    Physical Exam Constitutional: Oriented to person, place, and time.  Well-developed and well-nourished. No distress.  Last Weight  Most recent update: 12/21/2018 10:04 AM   Weight  104.3 kg (230 lb)           Vital Signs: BP (!) 146/73   Pulse 90   Temp 98.1 F (36.7 C) (Oral)   Resp 17   Ht 5\' 3"  (1.6 m)   Wt 104.3 kg   LMP 11/27/2018   SpO2 99%   BMI 40.74 kg/m   HENT:  Head: Normocephalic and atraumatic.  Eyes: Conjunctivae and EOM are normal. Right eye exhibits no discharge. Left eye exhibits no discharge. No scleral icterus.  Neck: No JVD present.  Pulmonary/Chest: Effort normal. No stridor. No respiratory distress.  Abdomen: obese,  soft, non distended Neurological:  alert and oriented to person, place, and time.  Skin: Skin is warm and dry.  not diaphoretic.  Psychiatric:   normal mood and affect.   behavior is normal. Judgment and thought content normal.   Mallampati Score: Review of Systems: A 12 point ROS discussed and pertinent positives are indicated in the HPI above.  All other systems are negative.    Imaging: No results found.  Labs:  CBC: Recent Labs    11/11/18 0814  WBC 3.7*  HGB 10.6*  HCT 32.3*  PLT 304    COAGS: No results for input(s): INR, APTT in the last 8760 hours.  BMP: Recent Labs    08/01/18 1503 11/11/18 0814  NA 138 136  K 3.4* 3.6  CL 104 106  CO2 28 25  GLUCOSE 111* 95  BUN 9 11  CALCIUM 9.1 8.6*  CREATININE 0.69 0.65  GFRNONAA  --  >60  GFRAA  --  >60    LIVER FUNCTION TESTS: Recent Labs    08/01/18 1503  BILITOT 0.2  AST 13  ALT 16  ALKPHOS 58  PROT 7.2  ALBUMIN 3.9    TUMOR MARKERS: No results for input(s): AFPTM, CEA, CA199, CHROMGRNA in the last 8760 hours.  Assessment and Plan:  My impression is that this patient's menorrhagia is  likely secondary to uterine fibroids. We spent the majority of the consultation discussing the pathophysiology of uterine leiomyomata, natural history, anticipated  involution post menopause, and treatment options. We discussed  myomectomy, hysterectomy, and uterine fibroid embolization. I described the technique of UFE, anticipated benefits, possible risks and complications including but not limited to bleeding, infection, vessel damage, nontarget embolization, and incomplete symptom relief. We discussed the 90% clinical success rate historically and at our experience with UFE. We discussed the post procedure course and time course of symptom resolution. We discussed the need for continued gynecologic care.  We discussed radial and femoral approach  options.  She seemed to understand and did ask appropriate questions, which were answered.  Based on her evaluation thus far, I think she would be an appropriate candidate for uterine fibroid embolization because of her symptomatology and  uterine fibroids. To complete her evaluation and workup, I would require pelvic MRI with contrast to best determine the exact site of location of her uterine fibroids, specifically to exclude any pedunculated fibroids on a narrow stalk, as well as to exclude any unexpected pelvic pathology.   After our discussion, the patient was motivated proceed with the evaluation. Accordingly, we can set up the MRI   at her convenience as an outpatient. If this looks okay, she may choose to proceed with UFE.   Thank you for this interesting consult.  I greatly enjoyed meeting Melissa White and look forward to participating in their care.  A copy of this report was sent to the requesting provider on this date.  Electronically Signed: Rickard Rhymes 12/21/2018, 11:01 AM   I spent a total of  30 Minutes   in face to face in clinical consultation, greater than 50% of which was counseling/coordinating care for symptomatic uterine fibroids.

## 2018-12-30 DIAGNOSIS — D251 Intramural leiomyoma of uterus: Secondary | ICD-10-CM | POA: Diagnosis not present

## 2018-12-30 DIAGNOSIS — D250 Submucous leiomyoma of uterus: Secondary | ICD-10-CM | POA: Diagnosis not present

## 2018-12-30 DIAGNOSIS — D252 Subserosal leiomyoma of uterus: Secondary | ICD-10-CM | POA: Diagnosis not present

## 2019-03-20 ENCOUNTER — Telehealth: Payer: Self-pay | Admitting: *Deleted

## 2019-03-20 NOTE — Telephone Encounter (Signed)
Well[facility] Guiles in [place] called to schedule UFE and patient states that she is no longer hurting and did not have heavy bleeding the last few months. She wants to hold off on the UFE. She call if anything changes.Cathren Harsh

## 2019-04-05 DIAGNOSIS — Z1151 Encounter for screening for human papillomavirus (HPV): Secondary | ICD-10-CM | POA: Diagnosis not present

## 2019-04-05 DIAGNOSIS — Z01419 Encounter for gynecological examination (general) (routine) without abnormal findings: Secondary | ICD-10-CM | POA: Diagnosis not present

## 2019-04-05 DIAGNOSIS — Z113 Encounter for screening for infections with a predominantly sexual mode of transmission: Secondary | ICD-10-CM | POA: Diagnosis not present

## 2019-04-05 DIAGNOSIS — Z118 Encounter for screening for other infectious and parasitic diseases: Secondary | ICD-10-CM | POA: Diagnosis not present

## 2019-04-05 DIAGNOSIS — Z3009 Encounter for other general counseling and advice on contraception: Secondary | ICD-10-CM | POA: Diagnosis not present

## 2019-04-05 DIAGNOSIS — Z0000 Encounter for general adult medical examination without abnormal findings: Secondary | ICD-10-CM | POA: Diagnosis not present

## 2019-05-12 DIAGNOSIS — Z79899 Other long term (current) drug therapy: Secondary | ICD-10-CM | POA: Diagnosis not present

## 2019-05-12 DIAGNOSIS — B20 Human immunodeficiency virus [HIV] disease: Secondary | ICD-10-CM | POA: Diagnosis not present

## 2019-06-05 DIAGNOSIS — I10 Essential (primary) hypertension: Secondary | ICD-10-CM | POA: Diagnosis not present

## 2019-08-03 ENCOUNTER — Ambulatory Visit (HOSPITAL_BASED_OUTPATIENT_CLINIC_OR_DEPARTMENT_OTHER)
Admission: RE | Admit: 2019-08-03 | Discharge: 2019-08-03 | Disposition: A | Discharge status: Home / Self Care | Payer: BLUE CROSS/BLUE SHIELD | Source: Ambulatory Visit | Attending: Medical | Admitting: Medical

## 2019-08-03 ENCOUNTER — Other Ambulatory Visit: Payer: Self-pay

## 2019-08-03 ENCOUNTER — Encounter: Payer: Self-pay | Admitting: Medical

## 2019-08-03 ENCOUNTER — Ambulatory Visit (INDEPENDENT_AMBULATORY_CARE_PROVIDER_SITE_OTHER): Payer: BLUE CROSS/BLUE SHIELD | Admitting: Medical

## 2019-08-03 VITALS — BP 135/84 | HR 86 | Temp 96.8°F | Resp 16 | Ht 63.0 in | Wt 230.6 lb

## 2019-08-03 DIAGNOSIS — H9201 Otalgia, right ear: Secondary | ICD-10-CM | POA: Diagnosis not present

## 2019-08-03 DIAGNOSIS — M542 Cervicalgia: Secondary | ICD-10-CM | POA: Diagnosis not present

## 2019-08-03 DIAGNOSIS — M25511 Pain in right shoulder: Secondary | ICD-10-CM

## 2019-08-03 DIAGNOSIS — M50323 Other cervical disc degeneration at C6-C7 level: Secondary | ICD-10-CM | POA: Diagnosis not present

## 2019-08-03 DIAGNOSIS — S46811A Strain of other muscles, fascia and tendons at shoulder and upper arm level, right arm, initial encounter: Secondary | ICD-10-CM | POA: Diagnosis not present

## 2019-08-03 DIAGNOSIS — M47812 Spondylosis without myelopathy or radiculopathy, cervical region: Secondary | ICD-10-CM | POA: Diagnosis not present

## 2019-08-03 MED ORDER — CYCLOBENZAPRINE HCL 5 MG PO TABS: 5.0000 mg | ORAL_TABLET | Freq: Every day | ORAL | 0 refills | Status: DC

## 2019-08-03 MED ORDER — KETOROLAC TROMETHAMINE 60 MG/2ML IM SOLN
60.0000 mg | Freq: Once | INTRAMUSCULAR | Status: AC
  Administered 2019-08-03: 60 mg via INTRAMUSCULAR

## 2019-08-03 NOTE — Patient Instructions (Addendum)
Your overall presentation appears to be muscular pain in areas of neck, rt trapezius, rt shoulder and rt upper pectoralis(but do want to evaluate c spine joints and shoulder). Areas of increased pain with range of motion and palpation.  Will get xray of cervical spine and rt shoulder xray.  Will give you toradol 60 mg im injection. Tomorrow start low dose ibuprofen 200-400 mg. Rx flexeril 5 mg for next 3-5 nights.  Rest until Monday. Glad to hear you are off from work.   Your ear looks normal. If any deep worse ear pain then my chart me and would give antibiotic.  Your pectoralis pain rt side and on movement. If any mid or left side chest  symptoms then recommend ED evaluation. On review of signs/symptoms no ekg indicated.  Follow up 7-10 days or as needed

## 2019-08-03 NOTE — Progress Notes (Addendum)
Subjective:    Patient ID: Melissa White, female    DOB: January 14, 1976, 43 y.o.   MRN: [id number]  HPI  Pt in for some neck, rt side neck, rt trapezius, and rt pectoralis. Some mild ear pain reported as well.   No sob, no wheezing, no leg pain, no left side chest pain or left shoulder/arm pain.  Pt has no skin rash. She has had pain for 2 days.   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  Pt is off from work until this coming Monday.  When moves rt shoulder area her pain is 6-7/10.   LMP- presently.     Review of Systems  Constitutional: Negative for appetite change, chills, fatigue and fever.  HENT: Positive for postnasal drip. Negative for congestion, dental problem and ear discharge.        Pt reports rt ear little uncomfortable.  Respiratory: Negative for cough, chest tightness, shortness of breath and wheezing.   Cardiovascular: Negative for chest pain and palpitations.  Gastrointestinal: Negative for abdominal pain and blood in stool.  Musculoskeletal: Negative for back pain.       See hpi.  Skin: Negative for rash.  Neurological: Negative for dizziness, speech difficulty, light-headedness and headaches.       No temporal pain.  Hematological: Negative for adenopathy. Does not bruise/bleed easily.  Psychiatric/Behavioral: Negative for behavioral problems and confusion. The patient is not nervous/anxious.     Past Medical History:  Diagnosis Date  . Allergy   . Anemia   . Fibroids   . HIV (human immunodeficiency virus infection) ([place])      Social History   Socioeconomic History  . Marital status: Married    Spouse name: Not on file  . Number of children: Not on file  . Years of education: Not on file  . Highest education level: Not on file  Occupational History  . Not on file  Social Needs  . Financial resource strain: Not on file  . Food insecurity    Worry: Not on file    Inability: Not on file  . Transportation needs    Medical: Not on file    Non-medical: Not on file  Tobacco Use  . Smoking status: Never Smoker  . Smokeless tobacco: Never Used  Substance and Sexual Activity  . Alcohol use: No  . Drug use: No  . Sexual activity: Not Currently    Birth control/protection: Condom  Lifestyle  . Physical activity    Days per week: Not on file    Minutes per session: Not on file  . Stress: Not on file  Relationships  . Social Herbalist on phone: Not on file    Gets together: Not on file    Attends religious service: Not on file    Active member of club or organization: Not on file    Attends meetings of clubs or organizations: Not on file    Relationship status: Not on file  . Intimate partner violence    Fear of current or ex partner: Not on file    Emotionally abused: Not on file    Physically abused: Not on file    Forced sexual activity: Not on file  Other Topics Concern  . Not on file  Social History Narrative  . Not on file    Past Surgical History:  Procedure Laterality Date  . CESAREAN SECTION     2  .  IR RADIOLOGIST EVAL & MGMT  12/21/2018    Family History  Problem Relation Age of Onset  . Diabetes Mother   . Arthritis Mother   . Hypertension Mother   . Kidney disease Father   . Hypertension Father     No Known Allergies  Current Outpatient Medications on File Prior to Visit  Medication Sig Dispense Refill  . BIKTARVY 50-200-25 MG TABS tablet TK 1 T PO  QD  5  . cetirizine (ZYRTEC) 10 MG tablet Take 1 tablet (10 mg total) by mouth daily. 30 tablet 0  . cyclobenzaprine (FLEXERIL) 10 MG tablet Take 10 mg by mouth 3 (three) times daily as needed for muscle spasms.    . ferrous sulfate 325 (65 FE) MG tablet Take 325 mg by mouth daily with breakfast.    . hydrocortisone cream 1 % Apply to affected area 2 times daily 15 g 0  . naproxen (NAPROSYN) 500 MG tablet Take 500 mg by mouth 2 (two) times daily with a meal.    . tranexamic acid (LYSTEDA) 650 MG TABS tablet  tranexamic acid 650 mg tablet    . valACYclovir (VALTREX) 500 MG tablet TAKE 1 TABLET BY MOUTH DAILY     No current facility-administered medications on file prior to visit.     There were no vitals taken for this visit.      Objective:   Physical Exam  General Mental Status- Alert. General Appearance- Not in acute distress.   heent- both canals clear and normal tm. No sinus pressure.  Skin General: Color- Normal Color. Moisture- Normal Moisture.  Neck Carotid Arteries- Normal color. Moisture- Normal Moisture. No carotid bruits. No JVD. No tracheal deviation. Rt sternocleidomastoid tenderness to palpation. Rt trapezius tender to palpation. Faint mid cervical spine area tender.  Chest and Lung Exam Auscultation: Breath Sounds:-Normal.  Cardiovascular Auscultation:Rythm- Regular. Murmurs & Other Heart Sounds:Auscultation of the heart reveals- No Murmurs.  Abdomen Inspection:-Inspeection Normal. Palpation/Percussion:Note:No mass. Palpation and Percussion of the abdomen reveal- Non Tender, Non Distended + BS, no rebound or guarding.   Neurologic Cranial Nerve exam:- CN III-XII intact(No nystagmus), symmetric smile. Strength:- 5/5 equal and symmetric strength both upper and lower extremities.  Rt shoulder- pain on palpation anterior aspect. Rt shoulder range of motion causes rt upper outer pectoralis pain. On pulling back on arm/stretch upper outer pectoralis pain.  Chest- rt pectoralis area slight pain to palpation.  Skin- no rash on inspection.         Assessment & Plan:  Your overall presentation appears to be muscular pain in areas of neck, rt trapezius, rt shoulder and rt upper pectoralis. Areas of increased pain with range of motion and palpation.  Will get xray of cervical spine and rt shoulder xray.  Will give you toradol 60 mg im injection. Tomorrow start meloxicam 7.5 mg daily. Rx flexeril 5 mg for next 3-5 nights.  Rest until Monday. Glad to hear you  are off from work.   Your ear looks normal. If any deep worse ear pain then my chart me and would give antibiotic.  Your pectoralis pain rt side and on movement. If any mid or left side chest  symptoms then recommend ED evaluation. On review of signs/symptoms no ekg indicated.  Follow up 7-10 days or as needed  25 minutes spent with pt. 50% of time spent counseling pt on plan going forward. Mackie Pai, PA-C

## 2019-08-06 ENCOUNTER — Encounter: Payer: Self-pay | Admitting: Medical

## 2019-08-07 ENCOUNTER — Encounter: Payer: Self-pay | Admitting: Medical

## 2019-08-07 ENCOUNTER — Encounter (HOSPITAL_BASED_OUTPATIENT_CLINIC_OR_DEPARTMENT_OTHER): Payer: Self-pay | Admitting: *Deleted

## 2019-08-07 ENCOUNTER — Ambulatory Visit (HOSPITAL_BASED_OUTPATIENT_CLINIC_OR_DEPARTMENT_OTHER)
Admission: RE | Admit: 2019-08-07 | Discharge: 2019-08-07 | Disposition: A | Discharge status: Home / Self Care | Payer: BLUE CROSS/BLUE SHIELD | Source: Ambulatory Visit | Attending: Medical | Admitting: Medical

## 2019-08-07 ENCOUNTER — Telehealth: Payer: Self-pay | Admitting: Medical

## 2019-08-07 ENCOUNTER — Other Ambulatory Visit: Payer: Self-pay

## 2019-08-07 ENCOUNTER — Ambulatory Visit (INDEPENDENT_AMBULATORY_CARE_PROVIDER_SITE_OTHER): Payer: BLUE CROSS/BLUE SHIELD | Admitting: Medical

## 2019-08-07 ENCOUNTER — Emergency Department (HOSPITAL_BASED_OUTPATIENT_CLINIC_OR_DEPARTMENT_OTHER)
Admission: EM | Admit: 2019-08-07 | Discharge: 2019-08-07 | Disposition: A | Discharge status: Home / Self Care | Payer: BLUE CROSS/BLUE SHIELD | Attending: Emergency Medicine | Admitting: Emergency Medicine

## 2019-08-07 ENCOUNTER — Emergency Department (HOSPITAL_BASED_OUTPATIENT_CLINIC_OR_DEPARTMENT_OTHER): Payer: BLUE CROSS/BLUE SHIELD

## 2019-08-07 VITALS — BP 129/75 | HR 87 | Temp 97.2°F | Resp 12 | Ht 63.0 in | Wt 230.6 lb

## 2019-08-07 DIAGNOSIS — R079 Chest pain, unspecified: Secondary | ICD-10-CM

## 2019-08-07 DIAGNOSIS — M25511 Pain in right shoulder: Secondary | ICD-10-CM

## 2019-08-07 DIAGNOSIS — M542 Cervicalgia: Secondary | ICD-10-CM | POA: Diagnosis not present

## 2019-08-07 DIAGNOSIS — Z79899 Other long term (current) drug therapy: Secondary | ICD-10-CM | POA: Diagnosis not present

## 2019-08-07 DIAGNOSIS — Z0389 Encounter for observation for other suspected diseases and conditions ruled out: Secondary | ICD-10-CM | POA: Diagnosis not present

## 2019-08-07 DIAGNOSIS — B20 Human immunodeficiency virus [HIV] disease: Secondary | ICD-10-CM | POA: Insufficient documentation

## 2019-08-07 DIAGNOSIS — S46811D Strain of other muscles, fascia and tendons at shoulder and upper arm level, right arm, subsequent encounter: Secondary | ICD-10-CM

## 2019-08-07 DIAGNOSIS — M62838 Other muscle spasm: Secondary | ICD-10-CM | POA: Diagnosis not present

## 2019-08-07 DIAGNOSIS — R0781 Pleurodynia: Secondary | ICD-10-CM

## 2019-08-07 DIAGNOSIS — R0789 Other chest pain: Secondary | ICD-10-CM | POA: Diagnosis not present

## 2019-08-07 LAB — BASIC METABOLIC PANEL
Anion gap: 10 (ref 5–15)
BUN: 12 mg/dL (ref 6–20)
CO2: 25 mmol/L (ref 22–32)
Calcium: 8.6 mg/dL — ABNORMAL LOW (ref 8.9–10.3)
Chloride: 103 mmol/L (ref 98–111)
Creatinine, Ser: 0.7 mg/dL (ref 0.44–1.00)
GFR calc Af Amer: >60 mL/min (ref >60)
GFR calc non Af Amer: >60 mL/min (ref >60)
Glucose, Bld: 99 mg/dL (ref 70–99)
Potassium: 3.5 mmol/L (ref 3.5–5.1)
Sodium: 138 mmol/L (ref 135–145)

## 2019-08-07 LAB — CBC WITH DIFFERENTIAL/PLATELET
Abs Immature Granulocytes: 0.01 10*3/uL (ref 0.00–0.07)
Basophils Absolute: 0 10*3/uL (ref 0.0–0.1)
Basophils Relative: 1 %
Eosinophils Absolute: 0.1 10*3/uL (ref 0.0–0.5)
Eosinophils Relative: 2 %
HCT: 33.2 % — ABNORMAL LOW (ref 36.0–46.0)
Hemoglobin: 11.1 g/dL — ABNORMAL LOW (ref 12.0–15.0)
Immature Granulocytes: 0 %
Lymphocytes Relative: 31 %
Lymphs Abs: 1.9 10*3/uL (ref 0.7–4.0)
MCH: 27.4 pg (ref 26.0–34.0)
MCHC: 33.4 g/dL (ref 30.0–36.0)
MCV: 82 fL (ref 80.0–100.0)
Monocytes Absolute: 0.5 10*3/uL (ref 0.1–1.0)
Monocytes Relative: 9 %
Neutro Abs: 3.5 10*3/uL (ref 1.7–7.7)
Neutrophils Relative %: 57 %
Platelets: 341 10*3/uL (ref 150–400)
RBC: 4.05 MIL/uL (ref 3.87–5.11)
RDW: 14.6 % (ref 11.5–15.5)
WBC: 6.1 10*3/uL (ref 4.0–10.5)
nRBC: 0 % (ref 0.0–0.2)

## 2019-08-07 LAB — HEPATIC FUNCTION PANEL
ALT: 18 U/L (ref 0–44)
AST: 15 U/L (ref 15–41)
Albumin: 3.5 g/dL (ref 3.5–5.0)
Alkaline Phosphatase: 57 U/L (ref 38–126)
Bilirubin, Direct: <0.1 mg/dL (ref 0.0–0.2)
Total Bilirubin: 0.4 mg/dL (ref 0.3–1.2)
Total Protein: 7.7 g/dL (ref 6.5–8.1)

## 2019-08-07 LAB — TROPONIN I (HIGH SENSITIVITY)
High Sens Troponin I: 3 ng/L (ref 2–17)
Troponin I (High Sensitivity): <2 ng/L (ref <18)

## 2019-08-07 LAB — D-DIMER, QUANTITATIVE: D-Dimer, Quant: 0.61 mcg/mL FEU — ABNORMAL HIGH (ref <0.50)

## 2019-08-07 LAB — HCG, QUANTITATIVE, PREGNANCY: hCG, Beta Chain, Quant, S: <1 m[IU]/mL (ref <5)

## 2019-08-07 MED ORDER — TIZANIDINE HCL 4 MG PO CAPS: 4.0000 mg | ORAL_CAPSULE | Freq: Three times a day (TID) | ORAL | 0 refills | Status: DC | PRN

## 2019-08-07 MED ORDER — TIZANIDINE HCL 4 MG PO TABS: 4.0000 mg | ORAL_TABLET | Freq: Three times a day (TID) | ORAL | 3 refills | Status: DC

## 2019-08-07 MED ORDER — IOHEXOL 350 MG/ML SOLN
100.0000 mL | Freq: Once | INTRAVENOUS | Status: AC | PRN
  Administered 2019-08-07: 100 mL via INTRAVENOUS

## 2019-08-07 MED ORDER — KETOROLAC TROMETHAMINE 30 MG/ML IJ SOLN
30.0000 mg | Freq: Once | INTRAMUSCULAR | Status: AC
  Administered 2019-08-07: 30 mg via INTRAVENOUS
  Filled 2019-08-07: qty 1

## 2019-08-07 MED ORDER — TRAMADOL HCL 50 MG PO TABS: 50.0000 mg | ORAL_TABLET | Freq: Three times a day (TID) | ORAL | 0 refills | Status: DC | PRN

## 2019-08-07 NOTE — Patient Instructions (Addendum)
You do have persisting neck pain, trapezius pain, shoulder pain and right side chest/pectoralis pain.  Findings on cervical spine x-ray could indicate a cause for some of pain but it is not clear presently.  Some concern since he only had minimal improvement day of Toradol injection and other meds prescribed did not help much at all.  Since you do have some pain on deep inspiration and your oxygen did drop some on walk in the office, I do think is best to get stat labs and chest x-ray.  If your D-dimer is elevated then we will need to get CT of chest.   We could offer you Toradol 60 mg IM injection today.  But also I would recommend you go to sports medicine office now and notify them that referral has been placed.  See if they could see you possibly by tomorrow or maybe today?.  Follow-up date with [place] to be determined.  Please keep your cell phone charged and your volume obstruction notify of test results.   ekg today sinus rhythm with appearance of low voltage.

## 2019-08-07 NOTE — ED Notes (Signed)
ED Provider at bedside. 

## 2019-08-07 NOTE — ED Notes (Signed)
Pt states that she saw her doctor on the 15th and was given naproxen from right should pain which she did not get any releif with. Pt states that today while walking with her PA her O2 sat dropped but came back up, they did blood work and she was found to have a elevated D-Dimer. Pt also reports that she "went off" her cycle 2 days ago and has history of fibroids and clotting.

## 2019-08-07 NOTE — Telephone Encounter (Signed)
Med changed to tablets and refilled.

## 2019-08-07 NOTE — Telephone Encounter (Signed)
Medication Refill - Medication: tiZANidine (ZANAFLEX) 4 MG capsule  Pt must have this in Tablet form not capsule, pharmacy will not refill it    Has the patient contacted their pharmacy? Yes.   (Agent: If no, request that the patient contact the pharmacy for the refill.) (Agent: If yes, when and what did the pharmacy advise?)  Preferred Pharmacy (with phone number or street name):  [id number] - [place], [place]  [address] [place] [address]  Phone: [phone] Fax: [phone]     Agent: Please be advised that RX refills may take up to 3 business days. We ask that you follow-up with your pharmacy.

## 2019-08-07 NOTE — Discharge Instructions (Signed)
Your work-up today was overall reassuring and the CT scan did not show any evidence of abnormality or pulmonary embolism.  We discussed on exam you had muscle spasms that were palpable and are likely the cause of her symptoms.  You may go to your chiropractor appointment tomorrow as they may have some other insight or management recommendations but please use the muscle relaxant prescribed earlier.  He may also use the pain medicine prescribed.  Please to hydrate and rest.  Your other labs were reassuring.  Please follow-up as directed and if any symptoms change or worsen, please return to the nearest emergency department.

## 2019-08-07 NOTE — ED Notes (Signed)
Patient transported to CT 

## 2019-08-07 NOTE — Telephone Encounter (Signed)
OK to change to tablet?

## 2019-08-07 NOTE — Telephone Encounter (Signed)
Yes - thank you

## 2019-08-07 NOTE — Telephone Encounter (Signed)
Patient seen in office today. 

## 2019-08-07 NOTE — Progress Notes (Signed)
Subjective:    Patient ID: Melissa White, female    DOB: 13-Jan-1976, 43 y.o.   MRN: [id number]  HPI  Pt in with persisting pain. Some neck area pain, rt trapzius pain, rt anterior shoulder pain and rt pectoralis area pain.  Pt states throbbing pain even at rest. With movement pain is worse. When she takes a deep breath it does hurts. No popliteal pain and no swelling of calfs. No shortness of breath. No left side chest pain. No left shoudler or arm pain.  When takes deep breath pain rt side pectoralis area.  Xray of cervical spine shows.  FINDINGS: Loss of cervical lordosis. No subluxation. No fracture. Prevertebral soft tissues are normal. Mild degenerative disc disease changes at C4-5 through C6-7 with disc space narrowing and anterior spurring.  IMPRESSION: Degenerative changes as above.  No acute bony abnormality.  Loss of cervical lordosis.  Xray of rt shoulder was negative.  Pt has been on flexeril and naprosyn and did not help did not help at all. Pain level is about 8/10.  Pt states toradol did help briefly. Brought pain level to level 2-3/10.        Review of Systems  Constitutional: Negative for chills, fatigue and fever.  HENT: Negative for congestion and ear pain.   Respiratory: Negative for cough, chest tightness, shortness of breath and wheezing.   Cardiovascular: Positive for chest pain. Negative for palpitations.       Rt side pectoral area pain.  Gastrointestinal: Negative for abdominal pain.  Musculoskeletal: Negative for back pain and neck pain.       See hpi.  Skin: Negative for rash.  Hematological: Negative for adenopathy. Does not bruise/bleed easily.  Psychiatric/Behavioral: Negative for behavioral problems, confusion and sleep disturbance. The patient is not nervous/anxious.     Past Medical History:  Diagnosis Date  . Allergy   . Anemia   . Fibroids   . HIV (human immunodeficiency virus infection) ([place])      Social History    Socioeconomic History  . Marital status: Married    Spouse name: Not on file  . Number of children: Not on file  . Years of education: Not on file  . Highest education level: Not on file  Occupational History  . Not on file  Social Needs  . Financial resource strain: Not on file  . Food insecurity    Worry: Not on file    Inability: Not on file  . Transportation needs    Medical: Not on file    Non-medical: Not on file  Tobacco Use  . Smoking status: Never Smoker  . Smokeless tobacco: Never Used  Substance and Sexual Activity  . Alcohol use: No  . Drug use: No  . Sexual activity: Not Currently    Birth control/protection: Condom  Lifestyle  . Physical activity    Days per week: Not on file    Minutes per session: Not on file  . Stress: Not on file  Relationships  . Social Herbalist on phone: Not on file    Gets together: Not on file    Attends religious service: Not on file    Active member of club or organization: Not on file    Attends meetings of clubs or organizations: Not on file    Relationship status: Not on file  . Intimate partner violence    Fear of current or ex partner: Not on file    Emotionally abused: Not  on file    Physically abused: Not on file    Forced sexual activity: Not on file  Other Topics Concern  . Not on file  Social History Narrative  . Not on file    Past Surgical History:  Procedure Laterality Date  . CESAREAN SECTION     2  . IR RADIOLOGIST EVAL & MGMT  12/21/2018    Family History  Problem Relation Age of Onset  . Diabetes Mother   . Arthritis Mother   . Hypertension Mother   . Kidney disease Father   . Hypertension Father     No Known Allergies  Current Outpatient Medications on File Prior to Visit  Medication Sig Dispense Refill  . BIKTARVY 50-200-25 MG TABS tablet TK 1 T PO  QD  5  . cetirizine (ZYRTEC) 10 MG tablet Take 1 tablet (10 mg total) by mouth daily. 30 tablet 0  . cyclobenzaprine (FLEXERIL) 5  MG tablet Take 1 tablet (5 mg total) by mouth at bedtime. 5 tablet 0  . ferrous sulfate 325 (65 FE) MG tablet Take 325 mg by mouth daily with breakfast.    . hydrocortisone cream 1 % Apply to affected area 2 times daily 15 g 0  . naproxen (NAPROSYN) 500 MG tablet Take 500 mg by mouth 2 (two) times daily with a meal.    . tranexamic acid (LYSTEDA) 650 MG TABS tablet tranexamic acid 650 mg tablet    . valACYclovir (VALTREX) 500 MG tablet TAKE 1 TABLET BY MOUTH DAILY     No current facility-administered medications on file prior to visit.     BP 129/75 (BP Location: Left Arm, Cuff Size: Large)   Pulse 87   Temp (!) 97.2 F (36.2 C) (Temporal)   Resp 12   Ht 5\' 3"  (1.6 m)   Wt 230 lb 9.6 oz (104.6 kg)   LMP 08/03/2019   SpO2 100%   BMI 40.85 kg/m       Objective:   Physical Exam   General Mental Status- Alert. General Appearance- Not in acute distress.   Skin General: Color- Normal Color. Moisture- Normal Moisture.  Neck Carotid Arteries- Normal color. Moisture- Normal Moisture. No carotid bruits. No JVD.no tracheal deviation. Rt trapezius still tender.  Mild pain mid cervical spine area. Rt shoulder- pain on abduction. Rt pectoralis- pain on palpation rt upper out er quadrant. This area hurs on deep inspiration.   Chest and Lung Exam Auscultation: Breath Sounds:-Normal.(walking down hall pt o2 sat went down to 92-93%. Briefly done to 91%. When came back to room came back up to 98%). Pulse max 105-110 during walk past the lab.+  Cardiovascular Auscultation:Rythm- Regular. Murmurs & Other Heart Sounds:Auscultation of the heart reveals- No Murmurs.  Abdomen Inspection:-Inspeection Normal. Palpation/Percussion:Note:No mass. Palpation and Percussion of the abdomen reveal- Non Tender, Non Distended + BS, no rebound or guarding.   Neurologic Cranial Nerve exam:- CN III-XII intact(No nystagmus), symmetric smile. Strength:- 5/5 equal and symmetric strength both upper and  lower extremities.   Lower ext- no pedal edema. calfs are symmetric.    Assessment & Plan:  You do have persisting region pain, trapezius pain, shoulder pain and right side chest/pectoralis pain.  Findings on cervical spine x-ray could indicate a cause for some of pain but it is not clear presently.  Some concern since he only had minimal improvement day of Toradol injection and other meds prescribed did not help much at all.  Since you do have some pain on  deep inspiration and your oxygen did drop some on walk in the office, I do think is best to get stat labs and chest x-ray.  If your D-dimer is elevated then we will need to get CT of chest.   We could offer you Toradol 60 mg IM injection today.  But also I would recommend you go to sports medicine office now and notify them that referral has been placed.  See if they could see you possibly by tomorrow or maybe today?.  Follow-up date with [place] to be determined.  Please keep your cell phone charged and your volume obstruction notify of test results.   Not pt oxygen was 100% when MA checked initially. Last check I got was 98%. Transient drop when walking. Not sure if was loose on her finger.  40 minutes spent with pt. 50% of time spent counseling pt on plan going forward. Answering pt questions as well.  Mackie Pai, PA-C

## 2019-08-07 NOTE — Progress Notes (Signed)
Melissa White - 43 y.o. female MRN [id number]  Date of birth: [birth year]  SUBJECTIVE:  Including CC & ROS.  Chief Complaint  Patient presents with  . Neck Pain    right-side x 1 week    Melissa White is a 43 y.o. female that is presenting with right-sided chest, shoulder and neck pain.  The pain is been ongoing for about a week.  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  The pain is been more constant as of late.  It is moderate to severe.  It seems to be occurring over the right trapezius as well as the right pec.  It is a burning sensation.  She had no improvement with naproxen.  Had some improvement with tramadol.  No prior surgery on the neck or shoulder.  Has had a port placed on the right side.  No radicular symptoms.  No numbness or tingling.  Independent review of the right shoulder x-ray from 10/15 shows no abnormalities.  Independent review of the cervical spine x-ray from 10/15 shows mild degenerative changes.  Independent review of the CT angio chest from 10/19 shows mild scoliosis in the thoracic region.   Review of Systems  Constitutional: Negative for fever.  HENT: Negative for congestion.   Respiratory: Negative for cough.   Cardiovascular: Positive for chest pain.  Gastrointestinal: Negative for abdominal pain.  Musculoskeletal: Positive for neck pain.  Skin: Negative for color change.  Neurological: Negative for weakness.  Hematological: Negative for adenopathy.    HISTORY: Past Medical, Surgical, Social, and Family History Reviewed & Updated per EMR.   Pertinent Historical Findings include:  Past Medical History:  Diagnosis Date  . Allergy   . Anemia   . Fibroids   . HIV (human immunodeficiency virus infection) ([place])     Past Surgical History:  Procedure Laterality Date  . CESAREAN SECTION     2  . IR RADIOLOGIST EVAL & MGMT  12/21/2018    No Known Allergies  Family History  Problem Relation Age of Onset  .  Diabetes Mother   . Arthritis Mother   . Hypertension Mother   . Kidney disease Father   . Hypertension Father      Social History   Socioeconomic History  . Marital status: Married    Spouse name: Not on file  . Number of children: Not on file  . Years of education: Not on file  . Highest education level: Not on file  Occupational History  . Not on file  Social Needs  . Financial resource strain: Not on file  . Food insecurity    Worry: Not on file    Inability: Not on file  . Transportation needs    Medical: Not on file    Non-medical: Not on file  Tobacco Use  . Smoking status: Never Smoker  . Smokeless tobacco: Never Used  Substance and Sexual Activity  . Alcohol use: No  . Drug use: No  . Sexual activity: Not Currently    Birth control/protection: Condom  Lifestyle  . Physical activity    Days per week: Not on file    Minutes per session: Not on file  . Stress: Not on file  Relationships  . Social Herbalist on phone: Not on file    Gets together: Not on file    Attends religious service: Not on file    Active member of club or organization: Not on file  Attends meetings of clubs or organizations: Not on file    Relationship status: Not on file  . Intimate partner violence    Fear of current or ex partner: Not on file    Emotionally abused: Not on file    Physically abused: Not on file    Forced sexual activity: Not on file  Other Topics Concern  . Not on file  Social History Narrative  . Not on file     PHYSICAL EXAM:  VS: BP (!) 142/79   Pulse 91   Ht 5\' 3"  (1.6 m)   Wt 230 lb (104.3 kg)   LMP 08/03/2019   BMI 40.74 kg/m  Physical Exam Gen: NAD, alert, cooperative with exam, well-appearing ENT: normal lips, normal nasal mucosa,  Eye: normal EOM, normal conjunctiva and lids CV:  no edema, +2 pedal pulses   Resp: no accessory muscle use, non-labored,  Skin: no rashes, no areas of induration  Neuro: normal tone, normal  sensation to touch Psych:  normal insight, alert and oriented MSK:  Neck: Normal flexion and extension. Normal lateral rotation. Tenderness to palpation over the paraspinal neck muscle in the cervical region. No midline thoracic or cervical tenderness to palpation. No winging of the scapula. Negative Spurling's test. Right shoulder: No tenderness to palpation of the AC joint. Normal active flexion and abduction. Normal internal and external rotation. Normal empty can test. Normal speeds test. Neurovascularly intact     ASSESSMENT & PLAN:   Costochondritis May have a component of nerve pain as well as a trigger point or costochondritis as the source of her pain.  Has had a CT angiogram that was normal as well as delta troponin that was negative.  Does not appear to be associated with the shoulder.  No significant degenerative changes in the cervical spine. -Prednisone. -Pennsaid.  Provided Pennsaid sample. -Counseled on home exercise therapy and supportive care. -If no improvement can consider trigger point injections or physical therapy.

## 2019-08-07 NOTE — ED Provider Notes (Signed)
[name] Provider Note   CSN: [id number] Arrival date & time: 08/07/19  1733     History   Chief Complaint Chief Complaint  Patient presents with   Shoulder Pain   Desaturation    HPI Melissa White is a 43 y.o. female.     The history is provided by the patient and medical records. No language interpreter was used.  Chest Pain Pain location:  R chest Pain quality: aching and tightness   Pain radiates to:  Neck Pain severity:  Mild Onset quality:  Gradual Timing:  Constant Progression:  Waxing and waning Chronicity:  New Context: movement and raising an arm   Relieved by:  Nothing Worsened by:  Nothing Associated symptoms: no abdominal pain, no back pain, no cough, no diaphoresis, no dizziness, no fatigue, no fever, no headache, no nausea, no numbness, no palpitations, no shortness of breath and no vomiting     Past Medical History:  Diagnosis Date   Allergy    Anemia    Fibroids    HIV (human immunodeficiency virus infection) ([place])     Patient Active Problem List   Diagnosis Date Noted   HIV positive ([place]) 06/16/2017   Menstrual cramps 06/16/2017    Past Surgical History:  Procedure Laterality Date   CESAREAN SECTION     2   IR RADIOLOGIST EVAL & MGMT  12/21/2018     OB History   No obstetric history on file.      Home Medications    Prior to Admission medications   Medication Sig Start Date End Date Taking? Authorizing Provider  BIKTARVY 50-200-25 MG TABS tablet TK 1 T PO  QD 12/17/17   [provider]  cetirizine (ZYRTEC) 10 MG tablet Take 1 tablet (10 mg total) by mouth daily. 08/02/16   Gloriann Loan, PA-C  cyclobenzaprine (FLEXERIL) 5 MG tablet Take 1 tablet (5 mg total) by mouth at bedtime. 08/03/19   Saguier, Percell Miller, PA-C  ferrous sulfate 325 (65 FE) MG tablet Take 325 mg by mouth daily with breakfast.    [provider]  hydrocortisone cream 1 % Apply to affected area 2 times  daily 03/15/17   Rodell Perna A, PA-C  naproxen (NAPROSYN) 500 MG tablet Take 500 mg by mouth 2 (two) times daily with a meal.    [provider]  tiZANidine (ZANAFLEX) 4 MG tablet Take 1 tablet (4 mg total) by mouth 3 (three) times daily. TAKE 1 TABLET (4MG  TOTAL) BY MOUTH 3 (THREE) TIMES DAILY AS NEEDED FOR MUSCLE SPASMS. 08/07/19   Copland, Gay Filler, MD  traMADol (ULTRAM) 50 MG tablet Take 1 tablet (50 mg total) by mouth every 8 (eight) hours as needed for up to 5 days. 08/07/19 08/12/19  Saguier, Percell Miller, PA-C  tranexamic acid (LYSTEDA) 650 MG TABS tablet tranexamic acid 650 mg tablet    [provider]  valACYclovir (VALTREX) 500 MG tablet TAKE 1 TABLET BY MOUTH DAILY 09/13/18   [provider]    Family History Family History  Problem Relation Age of Onset   Diabetes Mother    Arthritis Mother    Hypertension Mother    Kidney disease Father    Hypertension Father     Social History Social History   Tobacco Use   Smoking status: Never Smoker   Smokeless tobacco: Never Used  Substance Use Topics   Alcohol use: No   Drug use: No     Allergies   Patient has no  known allergies.   Review of Systems Review of Systems  Constitutional: Negative for chills, diaphoresis, fatigue and fever.  HENT: Negative for ear pain and sore throat.   Eyes: Negative for pain and visual disturbance.  Respiratory: Negative for cough, chest tightness, shortness of breath and wheezing.   Cardiovascular: Positive for chest pain. Negative for palpitations.  Gastrointestinal: Negative for abdominal pain, constipation, diarrhea, nausea and vomiting.  Genitourinary: Negative for dysuria, flank pain and hematuria.  Musculoskeletal: Positive for neck pain. Negative for arthralgias, back pain and neck stiffness.  Skin: Negative for color change, rash and wound.  Neurological: Negative for dizziness, seizures, syncope, light-headedness, numbness and headaches.    Psychiatric/Behavioral: Negative for agitation.  All other systems reviewed and are negative.    Physical Exam Updated Vital Signs BP 132/70    Pulse 98    Temp 97.9 F (36.6 C) (Oral)    Resp 16    Ht 5\' 3"  (1.6 m)    Wt 104.3 kg    LMP 08/03/2019    SpO2 100%    BMI 40.74 kg/m   Physical Exam Vitals signs and nursing note reviewed.  Constitutional:      General: She is not in acute distress.    Appearance: She is well-developed. She is not ill-appearing, toxic-appearing or diaphoretic.  HENT:     Head: Normocephalic and atraumatic.     Right Ear: External ear normal.     Left Ear: External ear normal.     Nose: Nose normal. No congestion or rhinorrhea.     Mouth/Throat:     Mouth: Mucous membranes are moist.     Pharynx: No oropharyngeal exudate.  Eyes:     Extraocular Movements: Extraocular movements intact.     Conjunctiva/sclera: Conjunctivae normal.     Pupils: Pupils are equal, round, and reactive to light.  Neck:     Musculoskeletal: Normal range of motion and neck supple. Muscular tenderness present. No neck rigidity, pain with movement or spinous process tenderness.   Cardiovascular:     Rate and Rhythm: Tachycardia present.     Pulses: Normal pulses.     Heart sounds: No murmur.  Pulmonary:     Effort: No respiratory distress.     Breath sounds: No stridor. No wheezing, rhonchi or rales.  Chest:     Chest wall: Tenderness present.  Abdominal:     General: Abdomen is flat. There is no distension.     Tenderness: There is no abdominal tenderness. There is no rebound.  Musculoskeletal:        General: Tenderness present.  Skin:    General: Skin is warm.     Findings: No erythema or rash.  Neurological:     General: No focal deficit present.     Mental Status: She is alert and oriented to person, place, and time.     Cranial Nerves: No cranial nerve deficit.     Sensory: No sensory deficit.     Motor: No weakness or abnormal muscle tone.      Coordination: Coordination normal.     Deep Tendon Reflexes: Reflexes are normal and symmetric.  Psychiatric:        Mood and Affect: Mood normal.      ED Treatments / Results  Labs (all labs ordered are listed, but only abnormal results are displayed) Labs Reviewed  CBC WITH DIFFERENTIAL/PLATELET - Abnormal; Notable for the following components:      Result Value   Hemoglobin 11.1 (*)  HCT 33.2 (*)    All other components within normal limits  BASIC METABOLIC PANEL - Abnormal; Notable for the following components:   Calcium 8.6 (*)    All other components within normal limits  HCG, QUANTITATIVE, PREGNANCY  HEPATIC FUNCTION PANEL  TROPONIN I (HIGH SENSITIVITY)    EKG None ED ECG REPORT   Date: 08/08/2019  Rate: 97  Rhythm: normal sinus rhythm  QRS Axis: normal  Intervals: normal  ST/T Wave abnormalities: normal  Conduction Disutrbances:none  Narrative Interpretation:   Old EKG Reviewed: unchanged  I have personally reviewed the EKG tracing and agree with the computerized printout as noted.    Radiology Dg Chest 2 View  Result Date: 08/07/2019 CLINICAL DATA:  Right side pleuritic chest pain EXAM: CHEST - 2 VIEW COMPARISON:  11/11/2018 FINDINGS: Heart and mediastinal contours are within normal limits. No focal opacities or effusions. No acute bony abnormality. IMPRESSION: No active cardiopulmonary disease. Electronically Signed   By: Rolm Baptise M.D.   On: 08/07/2019 11:46   Ct Angio Chest Pe W And/or Wo Contrast  Result Date: 08/07/2019 CLINICAL DATA:  PE suspected EXAM: CT ANGIOGRAPHY CHEST WITH CONTRAST TECHNIQUE: Multidetector CT imaging of the chest was performed using the standard protocol during bolus administration of intravenous contrast. Multiplanar CT image reconstructions and MIPs were obtained to evaluate the vascular anatomy. CONTRAST:  164mL OMNIPAQUE IOHEXOL 350 MG/ML SOLN COMPARISON:  None. FINDINGS: Cardiovascular: Satisfactory opacification of  the pulmonary arteries to the segmental level. No evidence of pulmonary embolism. Normal heart size. No pericardial effusion. Mediastinum/Nodes: No enlarged mediastinal, hilar, or axillary lymph nodes. Thyroid gland, trachea, and esophagus demonstrate no significant findings. Lungs/Pleura: Lungs are clear. No pleural effusion or pneumothorax. Upper Abdomen: No acute abnormality. Musculoskeletal: No chest wall abnormality. No acute or significant osseous findings. Review of the MIP images confirms the above findings. IMPRESSION: Negative examination for pulmonary embolism. Electronically Signed   By: Eddie Candle M.D.   On: 08/07/2019 19:46    Procedures Procedures (including critical care time)  Medications Ordered in ED Medications  iohexol (OMNIPAQUE) 350 MG/ML injection 100 mL (100 mLs Intravenous Contrast Given 08/07/19 1927)  ketorolac (TORADOL) 30 MG/ML injection 30 mg (30 mg Intravenous Given 08/07/19 2121)     Initial Impression / Assessment and Plan / ED Course  I have reviewed the triage vital signs and the nursing notes.  Pertinent labs & imaging results that were available during my care of the patient were reviewed by me and considered in my medical decision making (see chart for details).        Melissa White is a 43 y.o. female with a past medical history significant for HIV, anemia, and fibroids who presents from PCP for PE rule out.  Patient reports that she has been having right neck and right shoulder pain with muscle spasms going to her chest.  She reports that she went to her PCP and during ambulation, her oxygen saturation dropped to around 93% from 100.  Patient had a D-dimer checked and was elevated sending her to the emergency department for PE scan.  She reports she is not having current chest pain but is still having muscle spasms in her neck.  She denies recent trauma.  She denies any nausea vomiting, diaphoresis or shortness of breath.  She denies any extremity  symptoms.  No leg pain or leg swelling.  She reports the pain is mild to moderate at this time.  On my exam, patient is maintaining her  oxygen saturations on room air at 100% and her heart rate is slightly tachycardic at 109.  Patient has palpable muscle spasms in her neck and upper chest near the clavicle.  Lungs were clear and no murmur present.  Good pulses in all extremities.  No leg swelling seen.  EKG shows no STEMI.  As patient just had concern for possible PE with elevated D-dimer, decreased oxygen, and tachycardia, CT PE study was ordered.  CT scan reassuring.  No PE seen.  Patient reports that she has prescription for muscle relaxant and is going to see the chiropractor tomorrow.  Patient was encouraged to follow-up with her PCP as well.  Given her reassuring work-up and no evidence of PE, she was felt stable for discharge home with reassuring vitals.  Patient discharged in good condition with improved symptoms.  Final Clinical Impressions(s) / ED Diagnoses   Final diagnoses:  Muscle spasm  Chest pain, unspecified type    ED Discharge Orders    None      Clinical Impression: 1. Muscle spasm   2. Chest pain, unspecified type     Disposition: Discharge  Condition: Good  I have discussed the results, Dx and Tx plan with the pt(& family if present). He/she/they expressed understanding and agree(s) with the plan. Discharge instructions discussed at great length. Strict return precautions discussed and pt &/or family have verbalized understanding of the instructions. No further questions at time of discharge.    Discharge Medication List as of 08/07/2019  9:18 PM      Follow Up: Darreld Mclean, MD [address] [place] [address] [phone]     [facility] [address] [id number] [place] [phone]       Tomeka Kantner, Gwenyth Allegra, MD 08/08/19 [phone]

## 2019-08-07 NOTE — ED Triage Notes (Addendum)
Right shoulder pain. She was seen by her MD today and her oxygen sats dropped while walking. She was told to come here for follow up. Her DDimer was elevated.

## 2019-08-08 ENCOUNTER — Encounter: Payer: Self-pay | Admitting: Family Medicine

## 2019-08-08 ENCOUNTER — Ambulatory Visit: Payer: BLUE CROSS/BLUE SHIELD | Admitting: Family Medicine

## 2019-08-08 DIAGNOSIS — M940 Chondrocostal junction syndrome [Tietze]: Secondary | ICD-10-CM | POA: Diagnosis not present

## 2019-08-08 MED ORDER — PENNSAID 2 % TD SOLN: 1.0000 "application " | Freq: Two times a day (BID) | TRANSDERMAL | 3 refills | Status: DC

## 2019-08-08 MED ORDER — PREDNISONE 5 MG PO TABS: ORAL_TABLET | ORAL | 0 refills | Status: DC

## 2019-08-08 NOTE — Assessment & Plan Note (Signed)
May have a component of nerve pain as well as a trigger point or costochondritis as the source of her pain.  Has had a CT angiogram that was normal as well as delta troponin that was negative.  Does not appear to be associated with the shoulder.  No significant degenerative changes in the cervical spine. -Prednisone. -Pennsaid.  Provided Pennsaid sample. -Counseled on home exercise therapy and supportive care. -If no improvement can consider trigger point injections or physical therapy.

## 2019-08-08 NOTE — Progress Notes (Signed)
Medication Samples have been provided to the patient.  Drug name: Pennsaid       Strength: 2%        Qty: 1 Box  LOT: [id number]  Exp.Date: 05/2020  Dosing instructions: Use a peasize amount and rub gently.  The patient has been instructed regarding the correct time, dose, and frequency of taking this medication, including desired effects and most common side effects.   Sherrie George, [place] 12:14 PM 08/08/2019

## 2019-08-08 NOTE — Patient Instructions (Signed)
Nice to meet you Please try heat on the area  Please try the exercises  Please try a thera cane or massage over the area.   Please send me a message in MyChart with any questions or updates.  Please see me back in 2-3 weeks.  Send me a message if you think you will need restrictions with work when you return next week.   --Dr. Raeford Razor

## 2019-08-11 ENCOUNTER — Other Ambulatory Visit: Payer: Self-pay

## 2019-08-11 ENCOUNTER — Ambulatory Visit (INDEPENDENT_AMBULATORY_CARE_PROVIDER_SITE_OTHER): Payer: BLUE CROSS/BLUE SHIELD | Admitting: Family Medicine

## 2019-08-11 ENCOUNTER — Encounter: Payer: Self-pay | Admitting: Family Medicine

## 2019-08-11 VITALS — BP 132/76 | HR 99 | Temp 97.7°F | Ht 63.0 in | Wt 230.1 lb

## 2019-08-11 DIAGNOSIS — S29011A Strain of muscle and tendon of front wall of thorax, initial encounter: Secondary | ICD-10-CM

## 2019-08-11 DIAGNOSIS — S46811A Strain of other muscles, fascia and tendons at shoulder and upper arm level, right arm, initial encounter: Secondary | ICD-10-CM

## 2019-08-11 MED ORDER — PREDNISONE 20 MG PO TABS: 40.0000 mg | ORAL_TABLET | Freq: Every day | ORAL | 0 refills | Status: AC

## 2019-08-11 MED ORDER — OXYCODONE HCL 5 MG PO TABA: 2.5000 mg | ORAL_TABLET | Freq: Three times a day (TID) | ORAL | 0 refills | Status: DC | PRN

## 2019-08-11 NOTE — Progress Notes (Signed)
Musculoskeletal Exam  Patient: Melissa White DOB: 15-Apr-1976  DOS: 08/11/2019  SUBJECTIVE:  Chief Complaint:   Chief Complaint  Patient presents with  . Shoulder Pain    right shoulder and underarm pain    Melissa White is a 43 y.o.  female for evaluation and treatment of R chest wall/shoulder pain.   Onset:  2 weeks ago. No inj or change in activity.  Location: R shoulder/pec area  Character:  sharp, throbbing Progression of issue:  is unchanged Associated symptoms: Pain with full range of motion, radiation down the front right side of her chest, ear pain Treatment: to date has been tramadol, prednisone, muscle relaxants, topical NSAID.   Neurovascular symptoms: no  ROS: Musculoskeletal/Extremities: +R shoulder pain Neuro: No weakness  Past Medical History:  Diagnosis Date  . Allergy   . Anemia   . Fibroids   . HIV (human immunodeficiency virus infection) ([place])     Objective: VITAL SIGNS: BP 132/76 (BP Location: Left Arm, Patient Position: Sitting, Cuff Size: Normal)   Pulse 99   Temp 97.7 F (36.5 C) (Temporal)   Ht 5\' 3"  (1.6 m)   Wt 230 lb 2 oz (104.4 kg)   LMP 08/03/2019   SpO2 96%   BMI 40.76 kg/m  Constitutional: Well formed, well developed. No acute distress. Cardiovascular: Brisk cap refill Thorax & Lungs: No accessory muscle use Musculoskeletal: Right shoulder.   Normal active range of motion: yes.   Normal passive range of motion: yes Tenderness to palpation: Mild tenderness over the right trapezius and left cephalad portion of the pectoral Deformity: no Ecchymosis: no Tests positive: None Tests negative: Hawkins, Neer's, speeds, crossover, liftoff With resisted shoulder abduction, there was reproduction of pain Neurologic: Normal sensory function. No focal deficits noted. DTR's equal and symmetric in UE's. No clonus. Psychiatric: Normal mood. Age appropriate judgment and insight. Alert & oriented x 3.    Assessment:  Pectoralis muscle  strain, initial encounter - Plan: predniSONE (DELTASONE) 20 MG tablet, OxyCODONE HCl, Abuse Deter, (OXAYDO) 5 MG TABA  Strain of right trapezius muscle, initial encounter - Plan: predniSONE (DELTASONE) 20 MG tablet, OxyCODONE HCl, Abuse Deter, (OXAYDO) 5 MG TABA  Plan: Stretches and exercises for pectoral strain and trapezius strain provided.  We will do a heavier prednisone burst and oxycodone as needed.  Tylenol as needed.  Ice, heat, follow-up with sports medicine for possible trigger point injections if no improvement.  Warnings about oxycodone verbalized and written down. The patient voiced understanding and agreement to the plan.   [place], DO 08/11/19  5:22 PM

## 2019-08-11 NOTE — Patient Instructions (Addendum)
Ice/cold pack over area for 10-15 min twice daily.  Heat (pad or rice pillow in microwave) over affected area, 10-15 minutes twice daily.   OK to take Tylenol 1000 mg (2 extra strength tabs) or 975 mg (3 regular strength tabs) every 6 hours as needed.  Do not drink alcohol, do any illicit/street drugs, drive or do anything that requires alertness while on this medicine.   Pectoralis Major Rehab Ask your health care provider which exercises are safe for you. Do exercises exactly as told by your health care provider and adjust them as directed. It is normal to feel mild stretching, pulling, tightness, or discomfort as you do these exercises, but you should stop right away if you feel sudden pain or your pain gets worse.Do not begin these exercises until told by your health care provider. Stretching and range of motion exercises These exercises warm up your muscles and joints and improve the movement and flexibility of your shoulder. These exercises can also help to relieve pain, numbness, and tingling. Exercise A: Pendulum  1. Stand near a wall or a surface that you can hold onto for balance. 2. Bend at the waist and let your left / right arm hang straight down. Use your other arm to keep your balance. 3. Relax your arm and shoulder muscles, and move your hips and your trunk so your left / right arm swings freely. Your arm should swing because of the motion of your body, not because you are using your arm or shoulder muscles. 4. Keep moving so your arm swings in the following directions, as told by your health care provider: ? Side to side. ? Forward and backward. ? In clockwise and counterclockwise circles. 5. Slowly return to the starting position. Repeat 2 times. Complete this exercise 3 times per week. Exercise B: Abduction, standing 1. Stand and hold a broomstick, a cane, or a similar object. Place your hands a little more than shoulder-width apart on the object. Your left / right hand  should be palm-up, and your other hand should be palm-down. 2. While keeping your elbow straight and your shoulder muscles relaxed, push the stick across your body toward your left / right side. Raise your left / right arm to the side of your body and then over your head until you feel a stretch in your shoulder. ? Stop when you reach the angle that is recommended by your health care provider. ? Avoid shrugging your shoulder while you raise your arm. Keep your shoulder blade tucked down toward the middle of your spine. 3. Hold for 10 seconds. 4. Slowly return to the starting position. Repeat 2 times. Complete this exercise 3 times per week. Exercise C: Wand flexion, supine  1. Lie on your back. You may bend your knees for comfort. 2. Hold a broomstick, a cane, or a similar object so that your hands are about shoulder-width apart on the object. Your palms should face toward your feet. 3. Raise your left / right arm in front of your face, then behind your head (toward the floor). Use your other hand to help you do this. Stop when you feel a gentle stretch in your shoulder, or when you reach the angle that is recommended by your health care provider. 4. Hold for 3 seconds. 5. Use the broomstick and your other arm to help you return your left / right arm to the starting position. Repeat 2 times. Complete this exercise 3 times per week. Exercise D: Wand shoulder external  rotation 1. Stand and hold a broomstick, a cane, or a similar object so your handsare about shoulder-width apart on the object. 2. Start with your arms hanging down, then bend both elbows to an "L" shape (90 degrees). 3. Keep your left / right elbow at your side. Use your other hand to push the stick so your left / right forearm moves away from your body, out to your side. ? Keep your left / right elbow bent to 90 degrees and keep it against your side. ? Stop when you feel a gentle stretch in your shoulder, or when you reach the  angle recommended by your health care provider. 4. Hold for 10 seconds. 5. Use the stick to help you return your left / right arm to the starting position. Repeat 2 times. Complete this exercise 3 times per week. Strengthening exercises These exercises build strength and endurance in your shoulder. Endurance is the ability to use your muscles for a long time, even after your muscles get tired. Exercise E: Scapular protraction, standing 1. Stand so you are facing a wall. Place your feet about one arm-length away from the wall. 2. Place your hands on the wall and straighten your elbows. 3. Keep your hands on the wall as you push your upper back away from the wall. You should feel your shoulder blades sliding forward.Keep your elbows and your head still. ? If you are not sure that you are doing this exercise correctly, ask your health care provider for more instructions. 4. Hold for 3 seconds. 5. Slowly return to the starting position. Let your muscles relax completely before you repeat this exercise. Repeat 2 times. Complete this exercise 3 times per week. Exercise F: Shoulder blade squeezes  (scapular retraction) 1. Sit with good posture in a stable chair. Do not let your back touch the back of the chair. 2. Your arms should be at your sides with your elbows bent. You may rest your forearms on a pillow if that is more comfortable. 3. Squeeze your shoulder blades together. Bring them down and back. ? Keep your shoulders level. ? Do not lift your shoulders up toward your ears. 4. Hold for 3 seconds. 5. Return to the starting position. Repeat 2 times. Complete this exercise 3 times per week. This information is not intended to replace advice given to you by your health care provider. Make sure you discuss any questions you have with your health care provider. Document Released: 10/05/2005 Document Revised: 07/16/2016 Document Reviewed: 06/23/2015 Elsevier Interactive Patient Education  2018  [employer].    Trapezius stretches/exercises Do exercises exactly as told by your health care provider and adjust them as directed. It is normal to feel mild stretching, pulling, tightness, or discomfort as you do these exercises, but you should stop right away if you feel sudden pain or your pain gets worse.  Stretching and range of motion exercises These exercises warm up your muscles and joints and improve the movement and flexibility of your shoulder. These exercises can also help to relieve pain, numbness, and tingling. If you are unable to do any of the following for any reason, do not further attempt to do it.   Exercise A: Flexion, standing    1. Stand and hold a broomstick, a cane, or a similar object. Place your hands a little more than shoulder-width apart on the object. Your left / right hand should be palm-up, and your other hand should be palm-down. 2. Push the stick to raise  your left / right arm out to your side and then over your head. Use your other hand to help move the stick. Stop when you feel a stretch in your shoulder, or when you reach the angle that is recommended by your health care provider. ? Avoid shrugging your shoulder while you raise your arm. Keep your shoulder blade tucked down toward your spine. 3. Hold for 30 seconds. 4. Slowly return to the starting position. Repeat 2 times. Complete this exercise 3 times per week.  Exercise B: Abduction, supine    1. Lie on your back and hold a broomstick, a cane, or a similar object. Place your hands a little more than shoulder-width apart on the object. Your left / right hand should be palm-up, and your other hand should be palm-down. 2. Push the stick to raise your left / right arm out to your side and then over your head. Use your other hand to help move the stick. Stop when you feel a stretch in your shoulder, or when you reach the angle that is recommended by your health care provider. ? Avoid shrugging your  shoulder while you raise your arm. Keep your shoulder blade tucked down toward your spine. 3. Hold for 30 seconds. 4. Slowly return to the starting position. Repeat 2 times. Complete this exercise 3 times per week.  Exercise C: Flexion, active-assisted    1. Lie on your back. You may bend your knees for comfort. 2. Hold a broomstick, a cane, or a similar object. Place your hands about shoulder-width apart on the object. Your palms should face toward your feet. 3. Raise the stick and move your arms over your head and behind your head, toward the floor. Use your healthy arm to help your left / right arm move farther. Stop when you feel a gentle stretch in your shoulder, or when you reach the angle where your health care provider tells you to stop. 4. Hold for 30 seconds. 5. Slowly return to the starting position. Repeat 2 times. Complete this exercise 3 times per week.  Exercise D: External rotation and abduction    1. Stand in a door frame with one of your feet slightly in front of the other. This is called a staggered stance. 2. Choose one of the following positions as told by your health care provider: ? Place your hands and forearms on the door frame above your head. ? Place your hands and forearms on the door frame at the height of your head. ? Place your hands on the door frame at the height of your elbows. 3. Slowly move your weight onto your front foot until you feel a stretch across your chest and in the front of your shoulders. Keep your head and chest upright and keep your abdominal muscles tight. 4. Hold for 30 seconds. 5. To release the stretch, shift your weight to your back foot. Repeat 2 times. Complete this stretch 3 times per week.  Strengthening exercises These exercises build strength and endurance in your shoulder. Endurance is the ability to use your muscles for a long time, even after your muscles get tired. Exercise E: Scapular depression and adduction  1. Sit  on a stable chair. Support your arms in front of you with pillows, armrests, or a tabletop. Keep your elbows in line with the sides of your body. 2. Gently move your shoulder blades down toward your middle back. Relax the muscles on the tops of your shoulders and in the back  of your neck. 3. Hold for 3 seconds. 4. Slowly release the tension and relax your muscles completely before doing this exercise again. Repeat for a total of 10 repetitions. 5. After you have practiced this exercise, try doing the exercise without the arm support. Then, try the exercise while standing instead of sitting. Repeat 2 times. Complete this exercise 3 times per week.  Exercise F: Shoulder abduction, isometric    1. Stand or sit about 4-6 inches (10-15 cm) from a wall with your left / right side facing the wall. 2. Bend your left / right elbow and gently press your elbow against the wall. 3. Increase the pressure slowly until you are pressing as hard as you can without shrugging your shoulder. 4. Hold for 3 seconds. 5. Slowly release the tension and relax your muscles completely. Repeat for a total of 10 repetitions. Repeat 2 times. Complete this exercise 3 times per week.  Exercise G: Shoulder flexion, isometric    1. Stand or sit about 4-6 inches (10-15 cm) away from a wall with your left / right side facing the wall. 2. Keep your left / right elbow straight and gently press the top of your fist against the wall. Increase the pressure slowly until you are pressing as hard as you can without shrugging your shoulder. 3. Hold for 10-15 seconds. 4. Slowly release the tension and relax your muscles completely. Repeat for a total of 10 repetitions. Repeat 2 times. Complete this exercise 3 times per week.  Exercise H: Internal rotation    1. Sit in a stable chair without armrests, or stand. Secure an exercise band at your left / right side, at elbow height. 2. Place a soft object, such as a folded towel or a  small pillow, under your left / right upper arm so your elbow is a few inches (about 8 cm) away from your side. 3. Hold the end of the exercise band so the band stretches. 4. Keeping your elbow pressed against the soft object under your arm, move your forearm across your body toward your abdomen. Keep your body steady so the movement is only coming from your shoulder. 5. Hold for 3 seconds. 6. Slowly return to the starting position. Repeat for a total of 10 repetitions. Repeat 2 times. Complete this exercise 3 times per week.  Exercise I: External rotation    1. Sit in a stable chair without armrests, or stand. 2. Secure an exercise band at your left / right side, at elbow height. 3. Place a soft object, such as a folded towel or a small pillow, under your left / right upper arm so your elbow is a few inches (about 8 cm) away from your side. 4. Hold the end of the exercise band so the band stretches. 5. Keeping your elbow pressed against the soft object under your arm, move your forearm out, away from your abdomen. Keep your body steady so the movement is only coming from your shoulder. 6. Hold for 3 seconds. 7. Slowly return to the starting position. Repeat for a total of 10 repetitions. Repeat 2 times. Complete this exercise 3 times per week. Exercise J: Shoulder extension  1. Sit in a stable chair without armrests, or stand. Secure an exercise band to a stable object in front of you so the band is at shoulder height. 2. Hold one end of the exercise band in each hand. Your palms should face each other. 3. Straighten your elbows and lift your hands  up to shoulder height. 4. Step back, away from the secured end of the exercise band, until the band stretches. 5. Squeeze your shoulder blades together and pull your hands down to the sides of your thighs. Stop when your hands are straight down by your sides. Do not let your hands go behind your body. 6. Hold for 3 seconds. 7. Slowly return to  the starting position. Repeat for a total of 10 repetitions. Repeat 2 times. Complete this exercise 3 times per week.  Exercise K: Shoulder extension, prone    1. Lie on your abdomen on a firm surface so your left / right arm hangs over the edge. 2. Hold a 5 lb weight in your hand so your palm faces in toward your body. Your arm should be straight. 3. Squeeze your shoulder blade down toward the middle of your back. 4. Slowly raise your arm behind you, up to the height of the surface that you are lying on. Keep your arm straight. 5. Hold for 3 seconds. 6. Slowly return to the starting position and relax your muscles. Repeat for a total of 10 repetitions. Repeat 2 times. Complete this exercise 3 times per week.   Exercise L: Horizontal abduction, prone  1. Lie on your abdomen on a firm surface so your left / right arm hangs over the edge. 2. Hold a 5 lb weight in your hand so your palm faces toward your feet. Your arm should be straight. 3. Squeeze your shoulder blade down toward the middle of your back. 4. Bend your elbow so your hand moves up, until your elbow is bent to an "L" shape (90 degrees). With your elbow bent, slowly move your forearm forward and up. Raise your hand up to the height of the surface that you are lying on. ? Your upper arm should not move, and your elbow should stay bent. ? At the top of the movement, your palm should face the floor. 5. Hold for 3 seconds. 6. Slowly return to the starting position and relax your muscles. Repeat for a total of 10 repetitions. Repeat 2 times. Complete this exercise 3 times per week.  Exercise M: Horizontal abduction, standing  1. Sit on a stable chair, or stand. 2. Secure an exercise band to a stable object in front of you so the band is at shoulder height. 3. Hold one end of the exercise band in each hand. 4. Straighten your elbows and lift your hands straight in front of you, up to shoulder height. Your palms should face down,  toward the floor. 5. Step back, away from the secured end of the exercise band, until the band stretches. 6. Move your arms out to your sides, and keep your arms straight. 7. Hold for 3 seconds. 8. Slowly return to the starting position. Repeat for a total of 10 repetitions. Repeat 2 times. Complete this exercise 3 times per week.  Exercise N: Scapular retraction and elevation  1. Sit on a stable chair, or stand. 2. Secure an exercise band to a stable object in front of you so the band is at shoulder height. 3. Hold one end of the exercise band in each hand. Your palms should face each other. 4. Sit in a stable chair without armrests, or stand. 5. Step back, away from the secured end of the exercise band, until the band stretches. 6. Squeeze your shoulder blades together and lift your hands over your head. Keep your elbows straight. 7. Hold for 3 seconds.  8. Slowly return to the starting position. Repeat for a total of 10 repetitions. Repeat 2 times. Complete this exercise 3 times per week.  This information is not intended to replace advice given to you by your health care provider. Make sure you discuss any questions you have with your health care provider. Document Released: 10/05/2005 Document Revised: 06/11/2016 Document Reviewed: 08/22/2015 Elsevier Interactive Patient Education  2017 [employer].

## 2019-08-21 ENCOUNTER — Ambulatory Visit: Payer: BLUE CROSS/BLUE SHIELD | Admitting: Family Medicine

## 2019-08-21 ENCOUNTER — Encounter: Payer: Self-pay | Admitting: Family Medicine

## 2019-08-21 ENCOUNTER — Other Ambulatory Visit: Payer: Self-pay

## 2019-08-21 DIAGNOSIS — M940 Chondrocostal junction syndrome [Tietze]: Secondary | ICD-10-CM

## 2019-08-21 DIAGNOSIS — M545 Low back pain, unspecified: Secondary | ICD-10-CM | POA: Insufficient documentation

## 2019-08-21 NOTE — Assessment & Plan Note (Signed)
Seems to have resolved with the higher dose of prednisone. -Counseled on supportive care. -Follow-up as needed.

## 2019-08-21 NOTE — Progress Notes (Signed)
Medication Samples have been provided to the patient.  Drug name: Duexis       Strength: 800mg /26.6mg         Qty: 2 Boxes  LOTCK:[id number]  Exp.Date: 09/2020  Dosing instructions: Take 1 tablet by mouth three (3) times a day.  The patient has been instructed regarding the correct time, dose, and frequency of taking this medication, including desired effects and most common side effects.   Sherrie George, [place] 4:38 PM 08/21/2019

## 2019-08-21 NOTE — Progress Notes (Signed)
Melissa White - 43 y.o. female MRN [id number]  Date of birth: [birth year]  SUBJECTIVE:  Including CC & ROS.  Chief Complaint  Patient presents with  . Follow-up    follow up for neck and right shoulder    Melissa White is a 43 y.o. female that is presenting with acute lower back pain.  Is occurring on the right lower side.  She denies any specific inciting event.  The chest and neck and shoulder pain have resolved.  She was prescribed a higher dose of steroids as well as oxycodone.  The pain is been ongoing for about a week.  The pain is intermittent in nature.  She only has the pain with walking.  She denies any radicular symptoms.  No prior history of kidney stones.  She does have a history of fibroids and it may be related to that.    Review of Systems  Constitutional: Negative for fever.  HENT: Negative for congestion.   Respiratory: Negative for cough.   Cardiovascular: Negative for chest pain.  Gastrointestinal: Negative for abdominal pain.  Musculoskeletal: Positive for back pain.  Skin: Negative for color change.  Neurological: Negative for weakness.  Hematological: Negative for adenopathy.    HISTORY: Past Medical, Surgical, Social, and Family History Reviewed & Updated per EMR.   Pertinent Historical Findings include:  Past Medical History:  Diagnosis Date  . Allergy   . Anemia   . Fibroids   . HIV (human immunodeficiency virus infection) ([place])     Past Surgical History:  Procedure Laterality Date  . CESAREAN SECTION     2  . IR RADIOLOGIST EVAL & MGMT  12/21/2018    No Known Allergies  Family History  Problem Relation Age of Onset  . Diabetes Mother   . Arthritis Mother   . Hypertension Mother   . Kidney disease Father   . Hypertension Father      Social History   Socioeconomic History  . Marital status: Married    Spouse name: Not on file  . Number of children: Not on file  . Years of education: Not on file  . Highest education level: Not on  file  Occupational History  . Not on file  Social Needs  . Financial resource strain: Not on file  . Food insecurity    Worry: Not on file    Inability: Not on file  . Transportation needs    Medical: Not on file    Non-medical: Not on file  Tobacco Use  . Smoking status: Never Smoker  . Smokeless tobacco: Never Used  Substance and Sexual Activity  . Alcohol use: No  . Drug use: No  . Sexual activity: Not Currently    Birth control/protection: Condom  Lifestyle  . Physical activity    Days per week: Not on file    Minutes per session: Not on file  . Stress: Not on file  Relationships  . Social Herbalist on phone: Not on file    Gets together: Not on file    Attends religious service: Not on file    Active member of club or organization: Not on file    Attends meetings of clubs or organizations: Not on file    Relationship status: Not on file  . Intimate partner violence    Fear of current or ex partner: Not on file    Emotionally abused: Not on file    Physically abused: Not on file  Forced sexual activity: Not on file  Other Topics Concern  . Not on file  Social History Narrative  . Not on file     PHYSICAL EXAM:  VS: BP 133/82   Pulse 97   Ht 5\' 3"  (1.6 m)   Wt 230 lb (104.3 kg)   LMP 08/03/2019   BMI 40.74 kg/m  Physical Exam Gen: NAD, alert, cooperative with exam, well-appearing ENT: normal lips, normal nasal mucosa,  Eye: normal EOM, normal conjunctiva and lids CV:  no edema, +2 pedal pulses   Resp: no accessory muscle use, non-labored,  Skin: no rashes, no areas of induration  Neuro: normal tone, normal sensation to touch Psych:  normal insight, alert and oriented MSK:  Back: Tenderness to palpation on the right paraspinal muscle. No tenderness to palpation of the lumbar midline spine. Normal flexion and extension. Normal strength resistance with hip flexion. Negative straight leg raise. Neurovascularly intact     ASSESSMENT  & PLAN:   Costochondritis Seems to have resolved with the higher dose of prednisone. -Counseled on supportive care. -Follow-up as needed.  Low back pain Pain seems muscular in nature. No history of kidney stones. Does have fibroids and those may be contributing. Pain only with ambulation.  - provided duexis samples  - counseled on HEp and supportive care - if no improvement consider PT or imaging.

## 2019-08-21 NOTE — Patient Instructions (Signed)
Good to see you  Please try the exercises  Please try heat  Please try the duexis as needed.  Please send me a message in MyChart with any questions or updates.  Please see me back in 4 weeks or sooner if needed.   --Dr. Raeford Razor

## 2019-08-21 NOTE — Assessment & Plan Note (Signed)
Pain seems muscular in nature. No history of kidney stones. Does have fibroids and those may be contributing. Pain only with ambulation.  - provided duexis samples  - counseled on HEp and supportive care - if no improvement consider PT or imaging.

## 2019-08-22 ENCOUNTER — Ambulatory Visit: Payer: BLUE CROSS/BLUE SHIELD | Admitting: Family Medicine

## 2019-11-27 DIAGNOSIS — Z79899 Other long term (current) drug therapy: Secondary | ICD-10-CM | POA: Diagnosis not present

## 2019-11-27 DIAGNOSIS — B20 Human immunodeficiency virus [HIV] disease: Secondary | ICD-10-CM | POA: Diagnosis not present

## 2019-12-29 ENCOUNTER — Other Ambulatory Visit: Payer: Self-pay

## 2019-12-31 NOTE — Progress Notes (Signed)
[place] at [facility], [place], [place] [address] [phone]  Date:  01/01/2020   Name:  Melissa White   DOB:  December 14, 1975   MRN:  [id number]  PCP:  Darreld Mclean, MD    Chief Complaint: Breast Pain (right , under arm radiating to breast) and Shoulder Pain (right shoulder seen in past )   History of Present Illness:  Melissa White is a 44 y.o. very pleasant female patient who presents with the following:  Patient with history of HIV, here today to discuss shoulder and possibly breast pain- this all started in October of 2020 Her job is physically active, but she is not aware of any particular injury  Last seen by myself in October 2019 She was seen in the fall by Dr. Nani Ravens with concern of pectoralis muscle strain She also saw Dr. Raeford Razor with sports medicine in November for costochondritis  Her infectious disease care is through [facility]-Dr. Janith Lima.  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 Low back pain 08/21/2019  . Costochondritis 08/08/2019  . HIV positive ([place]) 06/16/2017  . Menstrual cramps 06/16/2017    Past Medical History:  Diagnosis Date  . Allergy   . Anemia   . Fibroids   . HIV (human immunodeficiency virus infection) ([place])     Past Surgical History:  Procedure Laterality Date  . CESAREAN SECTION     2  . IR RADIOLOGIST EVAL & MGMT  12/21/2018    Social History   Tobacco Use  . Smoking status: Never Smoker  . Smokeless tobacco: Never Used   Substance Use Topics  . Alcohol use: No  . Drug use: No    Family History  Problem Relation Age of Onset  . Diabetes Mother   . Arthritis Mother   . Hypertension Mother   . Kidney disease Father   . Hypertension Father     No Known Allergies  Medication list has been reviewed and updated.  Current Outpatient Medications on File Prior to Visit  Medication Sig Dispense Refill  . BIKTARVY 50-200-25 MG TABS tablet TK 1 T PO  QD  5  . cetirizine (ZYRTEC) 10 MG tablet Take 1 tablet (10 mg total) by mouth daily. 30 tablet 0  . ferrous sulfate 325 (65 FE) MG tablet Take 325 mg by mouth daily with breakfast.    . hydrocortisone cream 1 % Apply to affected area 2 times daily 15 g 0  . OxyCODONE HCl, Abuse Deter, (OXAYDO) 5 MG TABA Take 2.5-5 mg by mouth every 8 (eight) hours as needed. 12 tablet 0  . valACYclovir (VALTREX) 500 MG tablet TAKE 1 TABLET BY MOUTH DAILY     No current facility-administered medications on file prior to visit.    Review of Systems:  .As per HPI- otherwise negative. Her HIV disease under good control  Physical Examination: Vitals:   01/01/20 0935  BP: 140/82  Pulse: 80  Resp: 17  Temp: (!)  96.1 F (35.6 C)  SpO2: 98%   Vitals:   01/01/20 0935  Weight: 234 lb (106.1 kg)  Height: 5\' 3"  (1.6 m)   Body mass index is 41.45 kg/m. Ideal Body Weight: Weight in (lb) to have BMI = 25: 140.8  GEN: no acute distress.  Obese, looks well HEENT: Atraumatic, Normocephalic.  Ears and Nose: No external deformity. CV: RRR, No M/G/R. No JVD. No thrill. No extra heart sounds. PULM: CTA B, no wheezes, crackles, rhonchi. No retractions. No resp. distress. No accessory muscle use. ABD: S, NT, ND, +BS. No rebound. No HSM. EXTR: No c/c/e PSYCH: Normally interactive. Conversant.  Left shoulder is normal Her right shoulder is tender over the anterior humerus, at the insertion of the rotator cuff tendon.  She has normal range of motion, but discomfort with  impingement testing.  Normal strength The right breast is normal   Assessment and Plan: Chronic right shoulder pain - Plan: Ambulatory referral to Orthopedic Surgery  HIV positive ([place])  Encounter for screening mammogram for malignant neoplasm of breast - Plan: MM 3D SCREEN BREAST BILATERAL  Here today with right shoulder pain for several months.  At this point I do not believe she has a breast issue, but a shoulder joint issue.  Referral to orthopedics for further evaluation I also gave her some information about rotator cuff tendinitis stretches and strengthening exercises that she can start on now She is overdue for a screening mammogram, will order this for her today This visit occurred during the SARS-CoV-2 public health emergency.  Safety protocols were in place, including screening questions prior to the visit, additional usage of staff PPE, and extensive cleaning of exam room while observing appropriate contact time as indicated for disinfecting solutions.    Signed Lamar Blinks, MD

## 2020-01-01 ENCOUNTER — Encounter: Payer: Self-pay | Admitting: Family Medicine

## 2020-01-01 ENCOUNTER — Ambulatory Visit (INDEPENDENT_AMBULATORY_CARE_PROVIDER_SITE_OTHER): Payer: BLUE CROSS/BLUE SHIELD | Admitting: Family Medicine

## 2020-01-01 ENCOUNTER — Other Ambulatory Visit: Payer: Self-pay

## 2020-01-01 ENCOUNTER — Ambulatory Visit (HOSPITAL_BASED_OUTPATIENT_CLINIC_OR_DEPARTMENT_OTHER)
Admission: RE | Admit: 2020-01-01 | Discharge: 2020-01-01 | Disposition: A | Discharge status: Home / Self Care | Payer: BLUE CROSS/BLUE SHIELD | Source: Ambulatory Visit | Attending: Family Medicine | Admitting: Family Medicine

## 2020-01-01 ENCOUNTER — Encounter (HOSPITAL_BASED_OUTPATIENT_CLINIC_OR_DEPARTMENT_OTHER): Payer: Self-pay

## 2020-01-01 VITALS — BP 140/82 | HR 80 | Temp 96.1°F | Resp 17 | Ht 63.0 in | Wt 234.0 lb

## 2020-01-01 DIAGNOSIS — Z21 Asymptomatic human immunodeficiency virus [HIV] infection status: Secondary | ICD-10-CM

## 2020-01-01 DIAGNOSIS — Z1231 Encounter for screening mammogram for malignant neoplasm of breast: Secondary | ICD-10-CM | POA: Insufficient documentation

## 2020-01-01 DIAGNOSIS — M25511 Pain in right shoulder: Secondary | ICD-10-CM

## 2020-01-01 DIAGNOSIS — G8929 Other chronic pain: Secondary | ICD-10-CM | POA: Diagnosis not present

## 2020-01-01 NOTE — Patient Instructions (Signed)
Good to see you today! I will set you up to see an orthopedist regarding your right shoulder pain.  I suspect that you have rotator cuff tendonitis.  I gave you a handout with some exercises and stretches to try for now   You are overdue for a routine mammogram- please have this done today or asap

## 2020-01-16 DIAGNOSIS — M7521 Bicipital tendinitis, right shoulder: Secondary | ICD-10-CM | POA: Diagnosis not present

## 2020-06-17 DIAGNOSIS — Z79899 Other long term (current) drug therapy: Secondary | ICD-10-CM | POA: Diagnosis not present

## 2020-06-17 DIAGNOSIS — B20 Human immunodeficiency virus [HIV] disease: Secondary | ICD-10-CM | POA: Diagnosis not present

## 2020-08-08 DIAGNOSIS — Z113 Encounter for screening for infections with a predominantly sexual mode of transmission: Secondary | ICD-10-CM | POA: Diagnosis not present

## 2020-08-08 DIAGNOSIS — Z8742 Personal history of other diseases of the female genital tract: Secondary | ICD-10-CM | POA: Diagnosis not present

## 2020-08-08 DIAGNOSIS — Z1151 Encounter for screening for human papillomavirus (HPV): Secondary | ICD-10-CM | POA: Diagnosis not present

## 2020-08-08 DIAGNOSIS — Z01419 Encounter for gynecological examination (general) (routine) without abnormal findings: Secondary | ICD-10-CM | POA: Diagnosis not present

## 2020-08-23 ENCOUNTER — Encounter: Payer: Self-pay | Admitting: Medical

## 2020-08-23 ENCOUNTER — Ambulatory Visit (INDEPENDENT_AMBULATORY_CARE_PROVIDER_SITE_OTHER): Payer: BLUE CROSS/BLUE SHIELD | Admitting: Medical

## 2020-08-23 ENCOUNTER — Other Ambulatory Visit: Payer: Self-pay

## 2020-08-23 ENCOUNTER — Encounter: Payer: Self-pay | Admitting: Neurology

## 2020-08-23 ENCOUNTER — Ambulatory Visit (HOSPITAL_BASED_OUTPATIENT_CLINIC_OR_DEPARTMENT_OTHER)
Admission: RE | Admit: 2020-08-23 | Discharge: 2020-08-23 | Disposition: A | Discharge status: Home / Self Care | Payer: BLUE CROSS/BLUE SHIELD | Source: Ambulatory Visit | Attending: Medical | Admitting: Medical

## 2020-08-23 VITALS — BP 138/69 | HR 92 | Temp 98.1°F | Resp 12 | Ht 63.0 in | Wt 237.0 lb

## 2020-08-23 DIAGNOSIS — M25531 Pain in right wrist: Secondary | ICD-10-CM | POA: Insufficient documentation

## 2020-08-23 DIAGNOSIS — G629 Polyneuropathy, unspecified: Secondary | ICD-10-CM

## 2020-08-23 DIAGNOSIS — D649 Anemia, unspecified: Secondary | ICD-10-CM

## 2020-08-23 DIAGNOSIS — G5603 Carpal tunnel syndrome, bilateral upper limbs: Secondary | ICD-10-CM | POA: Diagnosis not present

## 2020-08-23 NOTE — Progress Notes (Signed)
Subjective:    Patient ID: Melissa White, female    DOB: Sep 14, 1976, 44 y.o.   MRN: [id number]  HPI  Pt in for rt wrist pain for about 2 weeks. Taking alleve which helps a little bit. Pt is rt handed. 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 She states fingers tingle and numb at time. Sometimes she wakes up middle of the night with tingle to rt hand/fingers. No neck pain.  Some left hand tingling at times as well.   Also has lower extremity burning and tingling to her lower legs. This has been going on years. No low back pain. Pt states Dr. Edilia Bo referred her but she missed the referral.  [facility] referral.   On referral Dr. Lorelei Pont put. An appointment is requested in approximately: 4 weeks.  Suspect neuropathy, perhaps caused by her HIV medications     Review of Systems  Constitutional: Negative for chills, fatigue and fever.  Respiratory: Negative for cough, chest tightness, shortness of breath and wheezing.   Cardiovascular: Negative for chest pain and palpitations.  Gastrointestinal: Negative for abdominal pain.  Musculoskeletal:       See hpi.  Skin: Negative for rash.  Neurological: Negative for dizziness, seizures, speech difficulty, weakness and light-headedness.       Some lower ext neuropathy hx.  Hematological: Negative for adenopathy. Does not bruise/bleed easily.  Psychiatric/Behavioral: Negative for behavioral problems and decreased concentration. The patient is not nervous/anxious.     Past Medical History:  Diagnosis Date  . Allergy   . Anemia   . Fibroids   . HIV (human immunodeficiency virus infection) ([place])      Social History   Socioeconomic History  . Marital status: Married    Spouse name: Not on file  . Number of children: Not on file  . Years of education: Not on file  . Highest education level: Not on file  Occupational History  . Not on file  Tobacco Use  . Smoking status: Never Smoker  . Smokeless  tobacco: Never Used  Substance and Sexual Activity  . Alcohol use: No  . Drug use: No  . Sexual activity: Not Currently    Birth control/protection: Condom  Other Topics Concern  . Not on file  Social History Narrative  . Not on file   Social Determinants of Health   Financial Resource Strain:   . Difficulty of Paying Living Expenses: Not on file  Food Insecurity:   . Worried About Charity fundraiser in the Last Year: Not on file  . Ran Out of Food in the Last Year: Not on file  Transportation Needs:   . Lack of Transportation (Medical): Not on file  . Lack of Transportation (Non-Medical): Not on file  Physical Activity:   . Days of Exercise per Week: Not on file  . Minutes of Exercise per Session: Not on file  Stress:   . Feeling of Stress : Not on file  Social Connections:   . Frequency of Communication with Friends and Family: Not on file  . Frequency of Social Gatherings with Friends and Family: Not on file  . Attends Religious Services: Not on file  . Active Member of Clubs or Organizations: Not on file  . Attends Archivist Meetings: Not on file  . Marital Status: Not on file  Intimate Partner Violence:   . Fear of Current or Ex-Partner: Not on file  . Emotionally Abused: Not on  file  . Physically Abused: Not on file  . Sexually Abused: Not on file    Past Surgical History:  Procedure Laterality Date  . CESAREAN SECTION     2  . IR RADIOLOGIST EVAL & MGMT  12/21/2018    Family History  Problem Relation Age of Onset  . Diabetes Mother   . Arthritis Mother   . Hypertension Mother   . Kidney disease Father   . Hypertension Father     No Known Allergies  Current Outpatient Medications on File Prior to Visit  Medication Sig Dispense Refill  . BIKTARVY 50-200-25 MG TABS tablet TK 1 T PO  QD  5  . cetirizine (ZYRTEC) 10 MG tablet Take 1 tablet (10 mg total) by mouth daily. 30 tablet 0  . ferrous sulfate 325 (65 FE) MG tablet Take 325 mg by mouth  daily with breakfast.    . hydrocortisone cream 1 % Apply to affected area 2 times daily 15 g 0  . OxyCODONE HCl, Abuse Deter, (OXAYDO) 5 MG TABA Take 2.5-5 mg by mouth every 8 (eight) hours as needed. 12 tablet 0  . valACYclovir (VALTREX) 500 MG tablet TAKE 1 TABLET BY MOUTH DAILY     No current facility-administered medications on file prior to visit.    BP 138/69 (BP Location: Right Arm, Cuff Size: Large)   Pulse 92   Temp 98.1 F (36.7 C) (Oral)   Resp 12   Ht 5\' 3"  (1.6 m)   Wt 237 lb (107.5 kg)   LMP 08/08/2020   SpO2 100%   BMI 41.98 kg/m       Objective:   Physical Exam  General- No acute distress. Pleasant patient. Neck- Full range of motion, no jvd Lungs- Clear, even and unlabored. Heart- regular rate and rhythm. Neurologic- CNII- XII grossly intact.  Lower ext- feet. Pulse intact. Sensation intact. Upper ext/wrist- on wrist manipulation no pain. No inducible tingling. Rt side radial ulnar styloid mild pain on palpation and on rom.       Assessment & Plan:  You do appear to have probable carpel tunnel syndrome bilaterally. By history worse on rt side. Recommend over the counter wrist cock up splint rt side. Low dose ibuprofen 200-400 mg every 8 hours for 5-7 days. Also get xray of rt wrist since some ulnar styloid pain. May refer to sports medicine as possible tendinitis component to pain.   For lower ext neuropathy for 2 years or more did place referral to neurologist as Dr. Lorelei Pont had wanted you to see neurologist in 2019. Will get b12 and b1 level today during interim  Hx of anemia. Will get cbc and iron level today.  Follow up 2-3 weeks or as needed

## 2020-08-23 NOTE — Addendum Note (Signed)
Addended by: Anabel Halon on: 08/23/2020 09:34 AM   Modules accepted: Orders

## 2020-08-23 NOTE — Patient Instructions (Addendum)
You do appear to have probable carpel tunnel syndrome bilaterally. By history worse on rt side. Recommend over the counter wrist cock up splint rt side. Low dose ibuprofen 200-400 mg every 8 hours for 5-7 days. Also get xray of rt wrist since some ulnar styloid pain. May refer to sports medicine as possible tendinitis component to pain.   For lower ext neuropathy for 2 years or more did place referral to neurologist as Dr. Lorelei Pont had wanted you to see neurologist in 2019. Will get b12 and b1 level today during interim  Hx of anemia. Will get cbc and iron level today.  Follow up 2-3 weeks or as needed

## 2020-08-23 NOTE — Addendum Note (Signed)
Addended by: Kelle Darting A on: 08/23/2020 09:47 AM   Modules accepted: Orders

## 2020-08-24 ENCOUNTER — Encounter: Payer: Self-pay | Admitting: Family Medicine

## 2020-08-24 ENCOUNTER — Encounter: Payer: Self-pay | Admitting: Medical

## 2020-08-24 LAB — CBC WITH DIFFERENTIAL/PLATELET
Absolute Monocytes: 451 cells/uL (ref 200–950)
Basophils Absolute: 32 cells/uL (ref 0–200)
Basophils Relative: 0.7 %
Eosinophils Absolute: 60 cells/uL (ref 15–500)
Eosinophils Relative: 1.3 %
HCT: 33.3 % — ABNORMAL LOW (ref 35.0–45.0)
Hemoglobin: 11.1 g/dL — ABNORMAL LOW (ref 11.7–15.5)
Lymphs Abs: 1362 cells/uL (ref 850–3900)
MCH: 28 pg (ref 27.0–33.0)
MCHC: 33.3 g/dL (ref 32.0–36.0)
MCV: 83.9 fL (ref 80.0–100.0)
MPV: 10.2 fL (ref 7.5–12.5)
Monocytes Relative: 9.8 %
Neutro Abs: 2696 cells/uL (ref 1500–7800)
Neutrophils Relative %: 58.6 %
Platelets: 334 10*3/uL (ref 140–400)
RBC: 3.97 10*6/uL (ref 3.80–5.10)
RDW: 14 % (ref 11.0–15.0)
Total Lymphocyte: 29.6 %
WBC: 4.6 10*3/uL (ref 3.8–10.8)

## 2020-08-24 LAB — VITAMIN B12: Vitamin B-12: 327 pg/mL (ref 200–1100)

## 2020-08-24 LAB — IRON: Iron: 293 ug/dL — ABNORMAL HIGH (ref 40–190)

## 2020-08-27 LAB — VITAMIN B1: Vitamin B1 (Thiamine): 15 nmol/L (ref 8–30)

## 2020-08-30 NOTE — Progress Notes (Addendum)
[place] at [facility] [address], [place], [place] [address] [phone]  Date:  09/09/2020   Name:  Melissa White   DOB:  09/24/1976   MRN:  [id number]  PCP:  Darreld Mclean, MD    Chief Complaint: Wrist Pain (2-3 week follow up, wrist injury, right wrist)   History of Present Illness:  Melissa White is a 44 y.o. very pleasant female patient who presents with the following:  Pt here today for a follow-up visit - history of HIV.  Under good control, levels are undetectable per pt report.  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  She had noted the CTS symptoms for a month or so She works at an assisted living facility, her job is physically active.  Her carpal tunnel type symptoms are worse in the right wrist, she is right-handed She did not get the wrist splints as of yet- she did try the ibuprofen.  Her wrists are improved  Her menses are heavy- she has a known history of fibroids.  She is thinking about a couple of solutions for this issue with her GYN provider  She has been taking iron since the late 1990s However, when her recent iron level came back elevated she stopped taking her iron Patient Active Problem List   Diagnosis Date Noted  . Low back pain 08/21/2019  . Costochondritis 08/08/2019  . HIV positive ([place]) 06/16/2017  . Menstrual cramps 06/16/2017    Past Medical History:  Diagnosis Date  . Allergy   . Anemia   . Fibroids   . HIV (human immunodeficiency virus infection) ([place])     Past Surgical History:  Procedure Laterality Date  .  CESAREAN SECTION     2  . IR RADIOLOGIST EVAL & MGMT  12/21/2018    Social History   Tobacco Use  . Smoking status: Never Smoker  . Smokeless tobacco: Never Used  Substance Use Topics  . Alcohol use: No  . Drug use: No    Family History  Problem Relation Age of Onset  . Diabetes Mother   . Arthritis Mother   . Hypertension Mother   . Kidney disease Father   . Hypertension Father     No Known Allergies  Medication list has been reviewed and updated.  Current Outpatient Medications on File Prior to Visit  Medication Sig Dispense Refill  . BIKTARVY 50-200-25 MG TABS tablet TK 1 T PO  QD  5  . cetirizine (ZYRTEC) 10 MG tablet Take 1 tablet (10 mg total) by mouth daily. 30 tablet 0  . ferrous sulfate 325 (65 FE) MG tablet Take 325 mg by mouth daily with breakfast.    . hydrocortisone cream 1 % Apply to affected area 2 times daily 15 g 0  . valACYclovir (VALTREX) 500 MG tablet TAKE 1 TABLET BY MOUTH DAILY     No current facility-administered medications on file prior to visit.    Review of Systems:  As per HPI- otherwise negative.   Physical Examination: Vitals:  09/09/20 0957  BP: 136/84  Pulse: 84  Resp: 17  SpO2: 97%   Vitals:   09/09/20 0957  Weight: 238 lb (108 kg)  Height: 5\' 3"  (1.6 m)   Body mass index is 42.16 kg/m. Ideal Body Weight: Weight in (lb) to have BMI = 25: 140.8  GEN: no acute distress.  Obese, looks well HEENT: Atraumatic, Normocephalic.  Ears and Nose: No external deformity. CV: RRR, No M/G/R. No JVD. No thrill. No extra heart sounds. PULM: CTA B, no wheezes, crackles, rhonchi. No retractions. No resp. distress. No accessory muscle use. ABD: S, NT, ND. No rebound. No HSM. EXTR: No c/c/e PSYCH: Normally interactive. Conversant.  Foot exam normal today- excellent pulses and normal circulation  Examined patient's right wrist.  She has some tenderness over the dorsum of the wrist on the ulnar aspect.  This seems more consistent with a  wrist strain or sprain as opposed to carpal tunnel syndrome at this time. No weakness of her grip strength or upper extremities bilaterally  Assessment and Plan: Iron excess - Plan: Folate, Iron, CANCELED: Iron Binding Cap (TIBC)([facility]), CANCELED: Ferritin  Anemia, unspecified type - Plan: IBC + Ferritin  HIV positive ([facility])  Other polyneuropathy - Plan: Comprehensive metabolic panel, Folate, TSH  Screening for diabetes mellitus - Plan: Hemoglobin A1c  Right wrist pain    Patient following up today with concern of elevated iron.  She is typically taking oral iron supplement due to anemia from chronic menorrhagia.  However, recent iron level is actually elevated.  I will recheck her iron today, also TIBC and ferritin level.  Plan further follow-up with patient pending this result  She is maintained on antiretroviral regimen for infectious disease at [facility]  Neuropathy in her lower extremities, may be due to long-term use of HIV medication.  No prior diagnosis of diabetes, will check A1c today.  We will also check a folate and complete metabolic profile She already has plans for follow-up with neurology  Wrist pain is somewhat better.  I did encourage her to try a wrist splint as needed for support if her symptoms return  This visit occurred during the SARS-CoV-2 public health emergency.  Safety protocols were in place, including screening questions prior to the visit, additional usage of staff PPE, and extensive cleaning of exam room while observing appropriate contact time as indicated for disinfecting solutions.    Signed Lamar Blinks, MD  addnd 11/23- received her labs as below  Results for orders placed or performed in visit on 09/09/20  Comprehensive metabolic panel  Result Value Ref Range   Sodium 139 135 - 145 mEq/L   Potassium 3.4 (L) 3.5 - 5.1 mEq/L   Chloride 104 96 - 112 mEq/L   CO2 27 19 - 32 mEq/L   Glucose, Bld 88 70 - 99 mg/dL   BUN  9 6 - 23 mg/dL   Creatinine, Ser 0.72 0.40 - 1.20 mg/dL   Total Bilirubin 0.5 0.2 - 1.2 mg/dL   Alkaline Phosphatase 62 39 - 117 U/L   AST 14 0 - 37 U/L   ALT 10 0 - 35 U/L   Total Protein 7.2 6.0 - 8.3 g/dL   Albumin 3.8 3.5 - 5.2 g/dL   GFR 101.99 >60.00 mL/min   Calcium 8.7 8.4 - 10.5 mg/dL  Folate  Result Value Ref Range   Folate 13.2 >5.9 ng/mL  Hemoglobin A1c  Result Value Ref Range   Hgb A1c MFr Bld 5.3 4.6 -  6.5 %  TSH  Result Value Ref Range   TSH 1.77 0.35 - 4.50 uIU/mL  IBC + Ferritin  Result Value Ref Range   Iron 41 (L) 42 - 145 ug/dL   Transferrin 261.0 212.0 - 360.0 mg/dL   Saturation Ratios 11.2 (L) 20.0 - 50.0 %   Ferritin 8.9 (L) 10.0 - 291.0 ng/mL

## 2020-09-06 DIAGNOSIS — B20 Human immunodeficiency virus [HIV] disease: Secondary | ICD-10-CM | POA: Diagnosis not present

## 2020-09-06 DIAGNOSIS — N852 Hypertrophy of uterus: Secondary | ICD-10-CM | POA: Diagnosis not present

## 2020-09-06 DIAGNOSIS — M311 Thrombotic microangiopathy, unspecified: Secondary | ICD-10-CM | POA: Diagnosis not present

## 2020-09-06 DIAGNOSIS — N920 Excessive and frequent menstruation with regular cycle: Secondary | ICD-10-CM | POA: Diagnosis not present

## 2020-09-06 DIAGNOSIS — D219 Benign neoplasm of connective and other soft tissue, unspecified: Secondary | ICD-10-CM | POA: Diagnosis not present

## 2020-09-06 DIAGNOSIS — D259 Leiomyoma of uterus, unspecified: Secondary | ICD-10-CM | POA: Diagnosis not present

## 2020-09-09 ENCOUNTER — Encounter: Payer: Self-pay | Admitting: Family Medicine

## 2020-09-09 ENCOUNTER — Ambulatory Visit (INDEPENDENT_AMBULATORY_CARE_PROVIDER_SITE_OTHER): Payer: BLUE CROSS/BLUE SHIELD | Admitting: Family Medicine

## 2020-09-09 ENCOUNTER — Other Ambulatory Visit: Payer: Self-pay

## 2020-09-09 DIAGNOSIS — Z131 Encounter for screening for diabetes mellitus: Secondary | ICD-10-CM

## 2020-09-09 DIAGNOSIS — G6289 Other specified polyneuropathies: Secondary | ICD-10-CM

## 2020-09-09 DIAGNOSIS — M25531 Pain in right wrist: Secondary | ICD-10-CM

## 2020-09-09 DIAGNOSIS — Z21 Asymptomatic human immunodeficiency virus [HIV] infection status: Secondary | ICD-10-CM | POA: Diagnosis not present

## 2020-09-09 DIAGNOSIS — D649 Anemia, unspecified: Secondary | ICD-10-CM | POA: Diagnosis not present

## 2020-09-09 LAB — COMPREHENSIVE METABOLIC PANEL
ALT: 10 U/L (ref 0–35)
AST: 14 U/L (ref 0–37)
Albumin: 3.8 g/dL (ref 3.5–5.2)
Alkaline Phosphatase: 62 U/L (ref 39–117)
BUN: 9 mg/dL (ref 6–23)
CO2: 27 mEq/L (ref 19–32)
Calcium: 8.7 mg/dL (ref 8.4–10.5)
Chloride: 104 mEq/L (ref 96–112)
Creatinine, Ser: 0.72 mg/dL (ref 0.40–1.20)
GFR: 101.99 mL/min (ref >60.00)
Glucose, Bld: 88 mg/dL (ref 70–99)
Potassium: 3.4 mEq/L — ABNORMAL LOW (ref 3.5–5.1)
Sodium: 139 mEq/L (ref 135–145)
Total Bilirubin: 0.5 mg/dL (ref 0.2–1.2)
Total Protein: 7.2 g/dL (ref 6.0–8.3)

## 2020-09-09 LAB — HEMOGLOBIN A1C: Hgb A1c MFr Bld: 5.3 % (ref 4.6–6.5)

## 2020-09-09 LAB — IBC + FERRITIN
Ferritin: 8.9 ng/mL — ABNORMAL LOW (ref 10.0–291.0)
Iron: 41 ug/dL — ABNORMAL LOW (ref 42–145)
Saturation Ratios: 11.2 % — ABNORMAL LOW (ref 20.0–50.0)
Transferrin: 261 mg/dL (ref 212.0–360.0)

## 2020-09-09 LAB — FOLATE: Folate: 13.2 ng/mL (ref >5.9)

## 2020-09-09 LAB — TSH: TSH: 1.77 u[IU]/mL (ref 0.35–4.50)

## 2020-09-09 NOTE — Patient Instructions (Addendum)
It was good to see you again!   I will be in touch with your labs and we will see how your iron levels look Good plan to see neurology If your wrist pain comes back I would try the splint as needed  Take care!

## 2020-09-10 ENCOUNTER — Encounter: Payer: Self-pay | Admitting: Family Medicine

## 2020-10-22 ENCOUNTER — Other Ambulatory Visit: Payer: Self-pay

## 2020-10-22 ENCOUNTER — Ambulatory Visit (INDEPENDENT_AMBULATORY_CARE_PROVIDER_SITE_OTHER): Payer: BC Managed Care – PPO | Admitting: Family

## 2020-10-22 VITALS — BP 134/70 | HR 92 | Temp 97.0°F | Resp 16 | Ht 63.0 in | Wt 238.0 lb

## 2020-10-22 DIAGNOSIS — N926 Irregular menstruation, unspecified: Secondary | ICD-10-CM | POA: Diagnosis not present

## 2020-10-22 DIAGNOSIS — N912 Amenorrhea, unspecified: Secondary | ICD-10-CM | POA: Diagnosis not present

## 2020-10-22 DIAGNOSIS — K219 Gastro-esophageal reflux disease without esophagitis: Secondary | ICD-10-CM | POA: Diagnosis not present

## 2020-10-22 LAB — POCT URINE PREGNANCY: Preg Test, Ur: NEGATIVE

## 2020-10-22 MED ORDER — OMEPRAZOLE 40 MG PO CPDR: 40.0000 mg | DELAYED_RELEASE_CAPSULE | Freq: Every day | ORAL | 3 refills | Status: DC

## 2020-10-22 NOTE — Patient Instructions (Addendum)
Please schedule a follow up appointment with your GYN. Start omeprazole once daily for reflux.

## 2020-10-22 NOTE — Progress Notes (Signed)
Subjective:    Patient ID: Melissa White, female    DOB: 08/06/1976, 45 y.o.   MRN: [id number]  HPI   Patient is a 45 yr old female who presents today with several concerns:  Amenorrhea- reports menstrual cycle is 14 days late.  UPT negative today in the office and she has completed several negative tests at home.  However upon further questioning she reports that she did have some light bleeding 12/18-12/22.  She is followed by GYN. She denies hot flashes. Reports that her cycle is typically regular.   She also reports that she has had a sour taste in her mouth after meals. She has been taking gas-x without improvement.  Also tried cimetidine. Feels like something is "stuck in the back of throat." She has associated nausea.  Has some mild stomach "sourness."   Review of Systems See HPI  Past Medical History:  Diagnosis Date  . Allergy   . Anemia   . Fibroids   . HIV (human immunodeficiency virus infection) ([facility])      Social History   Socioeconomic History  . Marital status: Married    Spouse name: Not on file  . Number of children: Not on file  . Years of education: Not on file  . Highest education level: Not on file  Occupational History  . Not on file  Tobacco Use  . Smoking status: Never Smoker  . Smokeless tobacco: Never Used  Substance and Sexual Activity  . Alcohol use: No  . Drug use: No  . Sexual activity: Not Currently    Birth control/protection: Condom  Other Topics Concern  . Not on file  Social History Narrative  . Not on file   Social Determinants of Health   Financial Resource Strain: Not on file  Food Insecurity: Not on file  Transportation Needs: Not on file  Physical Activity: Not on file  Stress: Not on file  Social Connections: Not on file  Intimate Partner Violence: Not on file    Past Surgical History:  Procedure Laterality Date  . CESAREAN SECTION     2  . IR RADIOLOGIST EVAL & MGMT  12/21/2018    Family History  Problem  Relation Age of Onset  . Diabetes Mother   . Arthritis Mother   . Hypertension Mother   . Kidney disease Father   . Hypertension Father     No Known Allergies  Current Outpatient Medications on File Prior to Visit  Medication Sig Dispense Refill  . BIKTARVY 50-200-25 MG TABS tablet TK 1 T PO  QD  5  . cetirizine (ZYRTEC) 10 MG tablet Take 1 tablet (10 mg total) by mouth daily. 30 tablet 0  . ferrous sulfate 325 (65 FE) MG tablet Take 325 mg by mouth daily with breakfast.    . hydrocortisone cream 1 % Apply to affected area 2 times daily 15 g 0  . valACYclovir (VALTREX) 500 MG tablet TAKE 1 TABLET BY MOUTH DAILY     No current facility-administered medications on file prior to visit.    BP 134/70 (BP Location: Left Arm, Patient Position: Sitting, Cuff Size: Large)   Pulse 92   Temp (!) 97 F (36.1 C) (Temporal)   Resp 16   Ht 5\' 3"  (1.6 m)   Wt 238 lb (108 kg)   SpO2 100%   BMI 42.16 kg/m       Objective:   Physical Exam Constitutional:      Appearance: She is well-developed  and well-nourished.  Cardiovascular:     Rate and Rhythm: Normal rate and regular rhythm.     Heart sounds: Normal heart sounds. No murmur heard.   Pulmonary:     Effort: Pulmonary effort is normal. No respiratory distress.     Breath sounds: Normal breath sounds. No wheezing.  Abdominal:     General: Bowel sounds are normal.     Palpations: Abdomen is soft.     Tenderness: There is no abdominal tenderness.  Psychiatric:        Mood and Affect: Mood and affect normal.        Behavior: Behavior normal.        Thought Content: Thought content normal.        Judgment: Judgment normal.           Assessment & Plan:  Irregular menstrual cycle- I advised the pt that her light bleeding likely represented a light period. We discussed that cycles can vary during the perimenopausal period.  She is advised to contact her GYN for further evaluation if she has future issues with her cycle.  GERD-  symptoms most likely consistent with GERD. Will initiate omeprazole 40mg  once daily. Patient is advised to schedule follow up with pcp in 4 weeks. If symptoms are not improved, we discussed possibility of referral to GI at that time.  This visit occurred during the SARS-CoV-2 public health emergency.  Safety protocols were in place, including screening questions prior to the visit, additional usage of staff PPE, and extensive cleaning of exam room while observing appropriate contact time as indicated for disinfecting solutions.

## 2020-10-23 ENCOUNTER — Telehealth: Payer: Self-pay

## 2020-10-23 NOTE — Telephone Encounter (Signed)
PA initiated via Covermymeds; KEY: [id number]. Awaiting determination.

## 2020-10-28 NOTE — Telephone Encounter (Signed)
PA denied. I have not seen denial fax information. Have you?

## 2020-10-28 NOTE — Telephone Encounter (Signed)
I have not. Will be on lookout.

## 2020-10-31 ENCOUNTER — Encounter: Payer: Self-pay | Admitting: Family Medicine

## 2020-10-31 NOTE — Telephone Encounter (Signed)
Received denial letter. PA denied because plan does not cover medications that there is a therapeutic equivalent to over-the-counter.

## 2020-10-31 NOTE — Telephone Encounter (Signed)
Spoke w/ Melissa White at [place] [phone])- requested that denial letter be refaxed, never received. She will fax to [phone].

## 2020-11-15 ENCOUNTER — Encounter: Payer: Self-pay | Admitting: Neurology

## 2020-11-15 ENCOUNTER — Ambulatory Visit: Payer: BC Managed Care – PPO | Admitting: Neurology

## 2020-11-15 ENCOUNTER — Other Ambulatory Visit: Payer: Self-pay

## 2020-11-15 VITALS — BP 130/87 | HR 97 | Ht 63.0 in | Wt 240.0 lb

## 2020-11-15 DIAGNOSIS — D259 Leiomyoma of uterus, unspecified: Secondary | ICD-10-CM | POA: Diagnosis not present

## 2020-11-15 DIAGNOSIS — R202 Paresthesia of skin: Secondary | ICD-10-CM | POA: Diagnosis not present

## 2020-11-15 DIAGNOSIS — Z3202 Encounter for pregnancy test, result negative: Secondary | ICD-10-CM | POA: Diagnosis not present

## 2020-11-15 DIAGNOSIS — N920 Excessive and frequent menstruation with regular cycle: Secondary | ICD-10-CM | POA: Diagnosis not present

## 2020-11-15 DIAGNOSIS — D509 Iron deficiency anemia, unspecified: Secondary | ICD-10-CM | POA: Diagnosis not present

## 2020-11-15 DIAGNOSIS — B20 Human immunodeficiency virus [HIV] disease: Secondary | ICD-10-CM | POA: Diagnosis not present

## 2020-11-15 NOTE — Progress Notes (Signed)
[facility] Clinic Note - Initial Visit   Date: 11/15/20  Melissa White MRN: [id number] DOB: 05/13/1976   Dear Dr. Lorelei Pont:  Thank you for your kind referral of Melissa White for consultation of generalized tingling. Although her history is well known to you, please allow [place] to reiterate it for the purpose of our medical record. The patient was accompanied to the clinic by self.    History of Present Illness: Melissa White is a 45 y.o. right-handed female with HIV presenting for evaluation of generalized numbness/tingling.   For the past 15 years, she has intermittent burning sensation in the lower legs which lasts few hours, occurring daily. There is no change in symptoms with rest or exercise.  She denies imbalance, weakness, or falls.  She also has tingling in the hands, which also occurs intermittently.  She was recommended to use wrist braces, but admits to being noncompliant.    She was diagnosed with HIV in 1999 and been on antiretroviral therapy since this time.  Out-side paper records, electronic medical record, and images have been reviewed where available and summarized as:  Lab Results  Component Value Date   HGBA1C 5.3 09/09/2020   Lab Results  Component Value Date   [id number] 829 08/23/2020   Lab Results  Component Value Date   TSH 1.77 09/09/2020   No results found for: ESRSEDRATE, POCTSEDRATE  Past Medical History:  Diagnosis Date  . Allergy   . Anemia   . Fibroids   . HIV (human immunodeficiency virus infection) ([place])     Past Surgical History:  Procedure Laterality Date  . CESAREAN SECTION     2  . IR RADIOLOGIST EVAL & MGMT  12/21/2018     Medications:  Outpatient Encounter Medications as of 11/15/2020  Medication Sig  . BIKTARVY 50-200-25 MG TABS tablet TK 1 T PO  QD  . cetirizine (ZYRTEC) 10 MG tablet Take 1 tablet (10 mg total) by mouth daily.  . ferrous sulfate 325 (65 FE) MG tablet Take 325 mg by mouth  daily with breakfast.  . hydrocortisone cream 1 % Apply to affected area 2 times daily  . omeprazole (PRILOSEC) 40 MG capsule Take 1 capsule (40 mg total) by mouth daily.  . valACYclovir (VALTREX) 500 MG tablet TAKE 1 TABLET BY MOUTH DAILY   No facility-administered encounter medications on file as of 11/15/2020.    Allergies: No Known Allergies  Family History: Family History  Problem Relation Age of Onset  . Diabetes Mother   . Arthritis Mother   . Hypertension Mother   . Kidney disease Father   . Hypertension Father     Social History: Social History   Tobacco Use  . Smoking status: Never Smoker  . Smokeless tobacco: Never Used  Substance Use Topics  . Alcohol use: No  . Drug use: No   Social History   Social History Narrative   Right handed    Lives in a one story home    Has three sons    Drinks Caffeine every once in awhile     Vital Signs:  BP 130/87   Pulse 97   Ht 5\' 3"  (1.6 m)   Wt 240 lb (108.9 kg)   SpO2 96%   BMI 42.51 kg/m   Neurological Exam: MENTAL STATUS including orientation to time, place, person, recent and remote memory, attention span and concentration, language, and fund of knowledge is normal.  Speech is not dysarthric.  CRANIAL NERVES: II:  No visual field defects.  III-IV-VI: Pupils equal round and reactive to light.  Normal conjugate, extra-ocular eye movements in all directions of gaze.  No nystagmus.  No ptosis.   V:  Normal facial sensation.    VII:  Normal facial symmetry and movements.   VIII:  Normal hearing and vestibular function.   IX-X:  Normal palatal movement.   XI:  Normal shoulder shrug and head rotation.   XII:  Normal tongue strength and range of motion, no deviation or fasciculation.  MOTOR:  No atrophy, fasciculations or abnormal movements.  No pronator drift.   Upper Extremity:  Right  Left  Deltoid  5/5   5/5   Biceps  5/5   5/5   Triceps  5/5   5/5   Infraspinatus 5/5  5/5  Medial pectoralis 5/5  5/5   Wrist extensors  5/5   5/5   Wrist flexors  5/5   5/5   Finger extensors  5/5   5/5   Finger flexors  5/5   5/5   Dorsal interossei  5/5   5/5   Abductor pollicis  5/5   5/5   Tone (Ashworth scale)  0  0   Lower Extremity:  Right  Left  Hip flexors  5/5   5/5   Hip extensors  5/5   5/5   Adductor 5/5  5/5  Abductor 5/5  5/5  Knee flexors  5/5   5/5   Knee extensors  5/5   5/5   Dorsiflexors  5/5   5/5   Plantarflexors  5/5   5/5   Toe extensors  5/5   5/5   Toe flexors  5/5   5/5   Tone (Ashworth scale)  0  0   MSRs:  Right        Left                  brachioradialis 2+  2+  biceps 2+  2+  triceps 2+  2+  patellar 2+  2+  ankle jerk 2+  2+  Hoffman no  no  plantar response down  down   SENSORY:  Normal and symmetric perception of light touch, pinprick, vibration, and proprioception.   COORDINATION/GAIT: Normal finger-to- nose-finger.  Intact rapid alternating movements bilaterally.  Able to rise from a chair without using arms.  Gait narrow based and stable. Tandem and stressed gait intact.    IMPRESSION: 1. Bilateral leg paresthesias, nonspecific. Normal exam makes neuropathy unlikely and symptoms do not confirm to a dermatomal distribution to suggest radiculopathy.   -NCS/EMG of the right leg to characterize the nature of her symptoms  2.  Bilateral hand paresthesias, ?carpal tunnel syndrome  - NCS/EMG of the right hand  - Encouraged compliance with using wrist splint  Further recommendations pending results.   Thank you for allowing me to participate in patient's care.  If I can answer any additional questions, I would be pleased to do so.    Sincerely,    Nasiya Pascual K. Posey Pronto, DO

## 2020-11-15 NOTE — Patient Instructions (Addendum)
Nerve testing of the right arm and leg.  Do no apply any lotion, cream, or oil to your skin  Start using a wrist brace  ELECTROMYOGRAM AND NERVE CONDUCTION STUDIES (EMG/NCS) INSTRUCTIONS  How to Prepare The neurologist conducting the EMG will need to know if you have certain medical conditions. Tell the neurologist and other EMG lab personnel if you: . Have a pacemaker or any other electrical medical device . Take blood-thinning medications . Have hemophilia, a blood-clotting disorder that causes prolonged bleeding Bathing Take a shower or bath shortly before your exam in order to remove oils from your skin. Don't apply lotions or creams before the exam.  What to Expect You'll likely be asked to change into a hospital gown for the procedure and lie down on an examination table. The following explanations can help you understand what will happen during the exam.  . Electrodes. The neurologist or a technician places surface electrodes at various locations on your skin depending on where you're experiencing symptoms. Or the neurologist may insert needle electrodes at different sites depending on your symptoms.  . Sensations. The electrodes will at times transmit a tiny electrical current that you may feel as a twinge or spasm. The needle electrode may cause discomfort or pain that usually ends shortly after the needle is removed. If you are concerned about discomfort or pain, you may want to talk to the neurologist about taking a short break during the exam.  . Instructions. During the needle EMG, the neurologist will assess whether there is any spontaneous electrical activity when the muscle is at rest - activity that isn't present in healthy muscle tissue - and the degree of activity when you slightly contract the muscle.  He or she will give you instructions on resting and contracting a muscle at appropriate times. Depending on what muscles and nerves the neurologist is examining, he or she may  ask you to change positions during the exam.  After your EMG You may experience some temporary, minor bruising where the needle electrode was inserted into your muscle. This bruising should fade within several days. If it persists, contact your primary care doctor.

## 2020-11-19 ENCOUNTER — Encounter: Payer: Self-pay | Admitting: Family Medicine

## 2020-11-19 NOTE — Progress Notes (Addendum)
[facility] [place], [place], [place] [address] [phone]  Date:  11/21/2020   Name:  Melissa White   DOB:  08-Nov-1975   MRN:  [id number]  PCP:  Darreld Mclean, MD    Chief Complaint: Headache (Just does not feel right ) and Menstrual Problem (Vaginal spotting, weakness, hx of fibroids//)   History of Present Illness:  Melissa White is a 45 y.o. very pleasant female patient who presents with the following:  Pt recently contacted me via mychart for concern of headaches and wishing to have lab work Last seen by myself in November  History of well controlled HIV At her last labs we noted low iron and I asked her to start back on iron supplement  She saw her GYN last week for menorrhagia and fibroids: 1. Irregular bleeding N92.6 POCT PREGNANCY, URINE  2. Menorrhagia with regular cycle N92.0  3. Human immunodeficiency virus disease ([place]) B20  4. Iron deficiency anemia, unspecified iron deficiency anemia type D50.9  5. Fibroids D21.9   Discussed several treatments for the menorrhagia and fibroids. The patient had initially planned a uterine artery embolization and she still considering this but we did discuss other options that she might be interested in including the Mirena IUD. I also discussed norethindrone and Depo-Provera. The patient had a lot of breakthrough bleeding and abnormal bleeding on the Depo-Provera. The patient declines endometrial biopsy today but I told her that if she has the uterine artery embolization they probably will want that and we can do an endometrial biopsy when the IUD is inserted . [name] has decided to try the Mirena IUD. Will call out doxycycline and cytotec. Also will do endometrial bx.   She is taking 325 iron once a day now  covid series- done  Flu vaccine- done   Pt is still thinking about an IUD vs an ablation for her menorrhagia She is not getting pain from her  fibroids Her menses may be irregular, can be heavy at times with clots She has suffered from heavy menstrual bleeding for a couple of years Her mother had the same thing  She is feeling tired She is not eating ice   She is taking prilosec OTC for GERD and epigastric tenderness- this does help  She has noted more frequent headaches since December- this is about when the menorrhagia started as well  She is seeing Dr Posey Pronto for parasthesias and they plan to do NCS Her HIV continues to be under very good control  Patient Active Problem List   Diagnosis Date Noted   Low back pain 08/21/2019   Costochondritis 08/08/2019   HIV positive ([place]) 06/16/2017   Menstrual cramps 06/16/2017    Past Medical History:  Diagnosis Date   Allergy    Anemia    Fibroids    HIV (human immunodeficiency virus infection) ([place])     Past Surgical History:  Procedure Laterality Date   CESAREAN SECTION     2   IR RADIOLOGIST EVAL & MGMT  12/21/2018    Social History   Tobacco Use   Smoking status: Never Smoker   Smokeless tobacco: Never Used  Substance Use Topics   Alcohol use: No   Drug use: No    Family History  Problem Relation Age of Onset   Diabetes Mother    Arthritis Mother    Hypertension Mother    Kidney disease Father    Hypertension  Father     No Known Allergies  Medication list has been reviewed and updated.  Current Outpatient Medications on File Prior to Visit  Medication Sig Dispense Refill   BIKTARVY 50-200-25 MG TABS tablet TK 1 T PO  QD  5   cetirizine (ZYRTEC) 10 MG tablet Take 1 tablet (10 mg total) by mouth daily. 30 tablet 0   ferrous sulfate 325 (65 FE) MG tablet Take 325 mg by mouth daily with breakfast.     hydrocortisone cream 1 % Apply to affected area 2 times daily 15 g 0   omeprazole (PRILOSEC) 40 MG capsule Take 1 capsule (40 mg total) by mouth daily. 30 capsule 3   valACYclovir (VALTREX) 500 MG tablet TAKE 1 TABLET BY MOUTH DAILY      No current facility-administered medications on file prior to visit.    Review of Systems:  As per HPI- otherwise negative.   Physical Examination: Vitals:   11/21/20 1459  BP: (!) 142/80  Pulse: 90  Resp: 18  SpO2: 99%   Vitals:   11/21/20 1459  Weight: 237 lb (107.5 kg)  Height: 5\' 3"  (1.6 m)   Body mass index is 41.98 kg/m. Ideal Body Weight: Weight in (lb) to have BMI = 25: 140.8  GEN: no acute distress.  Overweight, looks well otherwise HEENT: Atraumatic, Normocephalic.  Ears and Nose: No external deformity. CV: RRR, No M/G/R. No JVD. No thrill. No extra heart sounds. PULM: CTA B, no wheezes, crackles, rhonchi. No retractions. No resp. distress. No accessory muscle use. ABD: S, NT, ND, +BS. No rebound. No HSM.  abd is benign  EXTR: No c/c/e PSYCH: Normally interactive. Conversant.    Assessment and Plan: Menorrhagia with irregular cycle - Plan: CBC, Ferritin, Iron  Pt here today with concern of menorrhagia and possible anemia/ low iron Will plan further follow- up pending labs. If iron is quite low will plan to refer her to hematology for an infusion We discussed options for controlling menorrhagia - she plans to try an IUD first which is a reasonable idea  This visit occurred during the SARS-CoV-2 public health emergency.  Safety protocols were in place, including screening questions prior to the visit, additional usage of staff PPE, and extensive cleaning of exam room while observing appropriate contact time as indicated for disinfecting solutions.    Signed Lamar Blinks, MD  Received her labs 2/4- message to pt  Results for orders placed or performed in visit on 11/21/20  CBC  Result Value Ref Range   WBC 4.2 4.0 - 10.5 K/uL   RBC 4.60 3.87 - 5.11 Mil/uL   Platelets 274.0 150.0 - 400.0 K/uL   Hemoglobin 12.4 12.0 - 15.0 g/dL   HCT 36.9 36.0 - 46.0 %   MCV 80.2 78.0 - 100.0 fl   MCHC 33.6 30.0 - 36.0 g/dL   RDW 15.7 (H) 11.5 - 15.5 %   Ferritin  Result Value Ref Range   Ferritin 24.0 10.0 - 291.0 ng/mL  Iron  Result Value Ref Range   Iron 54 42 - 145 ug/dL

## 2020-11-21 ENCOUNTER — Other Ambulatory Visit: Payer: Self-pay

## 2020-11-21 ENCOUNTER — Encounter: Payer: Self-pay | Admitting: Family Medicine

## 2020-11-21 ENCOUNTER — Ambulatory Visit (INDEPENDENT_AMBULATORY_CARE_PROVIDER_SITE_OTHER): Payer: BC Managed Care – PPO | Admitting: Family Medicine

## 2020-11-21 VITALS — BP 142/80 | HR 90 | Resp 18 | Ht 63.0 in | Wt 237.0 lb

## 2020-11-21 DIAGNOSIS — N921 Excessive and frequent menstruation with irregular cycle: Secondary | ICD-10-CM | POA: Diagnosis not present

## 2020-11-21 NOTE — Patient Instructions (Signed)
Good to see you today-  I will be in touch with your labs and we can plan the next step I think getting the IUD is a good idea

## 2020-11-22 ENCOUNTER — Encounter: Payer: Self-pay | Admitting: Family Medicine

## 2020-11-22 DIAGNOSIS — R5383 Other fatigue: Secondary | ICD-10-CM

## 2020-11-22 LAB — CBC
HCT: 36.9 % (ref 36.0–46.0)
Hemoglobin: 12.4 g/dL (ref 12.0–15.0)
MCHC: 33.6 g/dL (ref 30.0–36.0)
MCV: 80.2 fl (ref 78.0–100.0)
Platelets: 274 10*3/uL (ref 150.0–400.0)
RBC: 4.6 Mil/uL (ref 3.87–5.11)
RDW: 15.7 % — ABNORMAL HIGH (ref 11.5–15.5)
WBC: 4.2 10*3/uL (ref 4.0–10.5)

## 2020-11-22 LAB — IRON: Iron: 54 ug/dL (ref 42–145)

## 2020-11-22 LAB — FERRITIN: Ferritin: 24 ng/mL (ref 10.0–291.0)

## 2020-12-16 DIAGNOSIS — Z79899 Other long term (current) drug therapy: Secondary | ICD-10-CM | POA: Diagnosis not present

## 2020-12-16 DIAGNOSIS — B20 Human immunodeficiency virus [HIV] disease: Secondary | ICD-10-CM | POA: Diagnosis not present

## 2020-12-16 DIAGNOSIS — G479 Sleep disorder, unspecified: Secondary | ICD-10-CM | POA: Diagnosis not present

## 2020-12-20 DIAGNOSIS — D259 Leiomyoma of uterus, unspecified: Secondary | ICD-10-CM | POA: Diagnosis not present

## 2020-12-20 DIAGNOSIS — D219 Benign neoplasm of connective and other soft tissue, unspecified: Secondary | ICD-10-CM | POA: Diagnosis not present

## 2020-12-20 DIAGNOSIS — D649 Anemia, unspecified: Secondary | ICD-10-CM | POA: Diagnosis not present

## 2020-12-20 DIAGNOSIS — Z30430 Encounter for insertion of intrauterine contraceptive device: Secondary | ICD-10-CM | POA: Diagnosis not present

## 2020-12-20 DIAGNOSIS — N921 Excessive and frequent menstruation with irregular cycle: Secondary | ICD-10-CM | POA: Diagnosis not present

## 2020-12-20 DIAGNOSIS — Z3202 Encounter for pregnancy test, result negative: Secondary | ICD-10-CM | POA: Diagnosis not present

## 2020-12-20 DIAGNOSIS — N920 Excessive and frequent menstruation with regular cycle: Secondary | ICD-10-CM | POA: Diagnosis not present

## 2021-01-01 ENCOUNTER — Other Ambulatory Visit: Payer: Self-pay

## 2021-01-01 ENCOUNTER — Ambulatory Visit: Payer: BC Managed Care – PPO | Admitting: Neurology

## 2021-01-01 DIAGNOSIS — D259 Leiomyoma of uterus, unspecified: Secondary | ICD-10-CM | POA: Diagnosis not present

## 2021-01-01 DIAGNOSIS — R202 Paresthesia of skin: Secondary | ICD-10-CM | POA: Diagnosis not present

## 2021-01-01 DIAGNOSIS — G5603 Carpal tunnel syndrome, bilateral upper limbs: Secondary | ICD-10-CM

## 2021-01-01 DIAGNOSIS — Z975 Presence of (intrauterine) contraceptive device: Secondary | ICD-10-CM | POA: Diagnosis not present

## 2021-01-01 DIAGNOSIS — N920 Excessive and frequent menstruation with regular cycle: Secondary | ICD-10-CM | POA: Diagnosis not present

## 2021-01-01 DIAGNOSIS — N946 Dysmenorrhea, unspecified: Secondary | ICD-10-CM | POA: Diagnosis not present

## 2021-01-01 NOTE — Progress Notes (Addendum)
    Follow-up Visit   Date: 01/01/21   Melissa White MRN: [id number] DOB: [birth year]   Interim History: Melissa White is a 45 y.o. right-handed female with HIV returning to the clinic for follow-up of generalized tingling. She works as a [employer] manager.  The patient was accompanied to the clinic by self.  History of present illness: For the past 15 years, she has intermittent burning sensation in the lower legs which lasts few hours, occurring daily. There is no change in symptoms with rest or exercise.  She denies imbalance, weakness, or falls.  She also has tingling in the hands, which also occurs intermittently.  She was recommended to use wrist braces, but admits to being noncompliant.    She was diagnosed with HIV in 1999 and been on antiretroviral therapy since this time.  UPDATE 01/01/2021:  She is here for EDX of the right arm and leg.  Her right hand is numb/tingling moreso than the left hand.  Hand symptoms are much more bothersome than her feet and lower legs.  See below for results.   Medications:  Current Outpatient Medications on File Prior to Visit  Medication Sig Dispense Refill  . BIKTARVY 50-200-25 MG TABS tablet TK 1 T PO  QD  5  . cetirizine (ZYRTEC) 10 MG tablet Take 1 tablet (10 mg total) by mouth daily. 30 tablet 0  . ferrous sulfate 325 (65 FE) MG tablet Take 325 mg by mouth daily with breakfast.    . hydrocortisone cream 1 % Apply to affected area 2 times daily 15 g 0  . omeprazole (PRILOSEC) 40 MG capsule Take 1 capsule (40 mg total) by mouth daily. 30 capsule 3  . valACYclovir (VALTREX) 500 MG tablet TAKE 1 TABLET BY MOUTH DAILY     No current facility-administered medications on file prior to visit.    Allergies: No Known Allergies  Vital Signs:  There were no vitals taken for this visit.   Exam deferred, see initial note on 11/15/2020  Data: NCS/EMG of the arms and right leg 01/01/2021: 1. Bilateral median neuropathy at or distal to the wrist,  consistent with a clinical diagnosis of carpal tunnel syndrome.  Overall, these findings are severe in degree electrically, and worse on the right. 2. Incidentally, there is a right Martin-Gruber anastomosis, a normal anatomic variant.  IMPRESSION/PLAN: Bilateral carpal tunnel syndrome, severe and worse on the right  - Results of NCS/EMG discussed with patient   - Unlikely to improve with conservative therapies alone, so will refer to hand orthopaedics.   - In the meantime, she may use wrist splints and avoid hyperflexion at the wrists  Bilateral leg paresthesias, nonspecific  - Patient reassured that there is no evidence of neuropathy or lumbosacral radiculopathy   Return to clinic as needed  Thank you for allowing me to participate in patient's care.  If I can answer any additional questions, I would be pleased to do so.    Sincerely,    Tammy Wickliffe K. Posey Pronto, DO

## 2021-01-01 NOTE — Procedures (Signed)
[facility]  [place], [place]  [place],  [address] Tel: [phone] Fax:  [phone] Test Date:  01/01/2021  Patient: Melissa White DOB: 1976/08/15 Physician: Narda Amber, DO  Sex: Female Height: 5\' 3"  Ref Phys: Narda Amber, DO  ID#: [id number]   Technician:    Patient Complaints: This is a 45 year old female referred for evaluation of numbness and tingling involving the hands and feet.  NCV & EMG Findings: Extensive electrodiagnostic testing of the right upper extremity, right lower extremity, and additional studies of the left upper extremity shows:  1. Bilateral median sensory responses are absent.  Right ulnar, sural, and superficial peroneal sensory responses are within normal limits. 2. Right median motor response shows severely prolonged latency (12.3 ms) and reduced amplitude (3.4 mV).  Of note, there is evidence of right Martin-Gruber anastomosis as seen by a motor response stimulating at the ulnar-wrist and recording at the abductor pollicis brevis muscle.  Left median motor response shows prolonged latency (6.7 ms) and normal amplitude.  Right ulnar, peroneal, and tibial motor responses are within normal limits.   3. Right tibial H reflex study is within normal limits. 4. 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  The remaining tested muscles showed normal motor unit recruitment and configuration.  Impression: 1. Bilateral median neuropathy at or distal to the wrist, consistent with a clinical diagnosis of carpal tunnel syndrome.  Overall, these findings are severe in degree electrically, and worse on the right. 2. Incidentally, there is a right Martin-Gruber anastomosis, a normal anatomic variant.   ___________________________ Narda Amber, DO    Nerve Conduction Studies Anti Sensory Summary Table   Stim Site NR Peak (ms) Norm Peak (ms)  P-T Amp (V) Norm P-T Amp  Left Median Anti Sensory (2nd Digit)  34C  Wrist NR  <3.4  >20  Right Median Anti Sensory (2nd Digit)  34C  Wrist NR  <3.4  >20  Right Sup Peroneal Anti Sensory (Ant Lat Mall)  34C  12 cm    2.3 <4.5 12.0 >5  Right Sural Anti Sensory (Lat Mall)  34C  Calf    2.7 <4.5 26.3 >5  Right Ulnar Anti Sensory (5th Digit)  34C  Wrist    2.4 <3.1 26.8 >12   Motor Summary Table   Stim Site NR Onset (ms) Norm Onset (ms) O-P Amp (mV) Norm O-P Amp Site1 Site2 Delta-0 (ms) Dist (cm) Vel (m/s) Norm Vel (m/s)  Left Median Motor (Abd Poll Brev)  34C  Wrist    6.7 <3.9 11.1 >6 Elbow Wrist 5.0 30.0 60 >50  Elbow    11.7  9.7         Right Median Motor (Abd Poll Brev)  34C  Wrist    12.3 <3.9 3.4 >6 Elbow Wrist 5.4 27.0 50 >50  Elbow    17.7  3.0  Ulnar-wrist crossover Elbow 13.9 0.0    Ulnar-wrist crossover    3.8  5.8         Right Peroneal Motor (Ext Dig Brev)  34C  Ankle    2.6 <5.5 8.7 >3 B Fib Ankle 6.5 37.0 57 >40  B Fib    9.1  8.7  Poplt B Fib 1.2 7.0 58 >40  Poplt    10.3  8.7         Right Tibial Motor (Abd Hall Brev)  34C  Ankle  2.6 <6.0 11.7 >8 Knee Ankle 8.5 38.0 45 >40  Knee    11.1  9.1         Right Ulnar Motor (Abd Dig Minimi)  34C  Wrist    1.8 <3.1 10.5 >7 B Elbow Wrist 3.7 24.0 65 >50  B Elbow    5.5  9.6  A Elbow B Elbow 1.5 10.0 67 >50  A Elbow    7.0  9.2          H Reflex Studies   NR H-Lat (ms) Lat Norm (ms) L-R H-Lat (ms)  Right Tibial (Gastroc)  34C     28.16 <35    EMG   Side Muscle Ins Act Fibs Psw Fasc Number Recrt Dur Dur. Amp Amp. Poly Poly. Comment  Right AntTibialis Nml Nml Nml Nml Nml Nml Nml Nml Nml Nml Nml Nml N/A  Right Gastroc Nml Nml Nml Nml Nml Nml Nml Nml Nml Nml Nml Nml N/A  Right Flex Dig Long Nml Nml Nml Nml Nml Nml Nml Nml Nml Nml Nml Nml N/A  Right RectFemoris Nml Nml Nml Nml Nml Nml Nml Nml Nml Nml Nml Nml N/A  Right GluteusMed Nml Nml Nml Nml Nml Nml Nml Nml Nml Nml Nml Nml N/A  Right 1stDorInt Nml  Nml Nml Nml Nml Nml Nml Nml Nml Nml Nml Nml N/A  Right Abd Poll Brev Nml 1+ Nml Nml 2- Rapid Many 1+ Many 1+ Many 1+ N/A  Right PronatorTeres Nml Nml Nml Nml Nml Nml Nml Nml Nml Nml Nml Nml N/A  Right Biceps Nml Nml Nml Nml Nml Nml Nml Nml Nml Nml Nml Nml N/A  Right Triceps Nml Nml Nml Nml Nml Nml Nml Nml Nml Nml Nml Nml N/A  Right Deltoid Nml Nml Nml Nml Nml Nml Nml Nml Nml Nml Nml Nml N/A  Left PronatorTeres Nml Nml Nml Nml Nml Nml Nml Nml Nml Nml Nml Nml N/A  Left Abd Poll Brev Nml Nml Nml Nml 1- Rapid Few 1+ Few 1+ Few 1+ N/A      Waveforms:

## 2021-01-06 ENCOUNTER — Encounter: Payer: Self-pay | Admitting: Neurology

## 2021-01-06 ENCOUNTER — Ambulatory Visit: Payer: BC Managed Care – PPO | Admitting: Neurology

## 2021-01-06 VITALS — BP 132/84 | HR 104 | Ht 63.0 in | Wt 246.0 lb

## 2021-01-06 DIAGNOSIS — Z820 Family history of epilepsy and other diseases of the nervous system: Secondary | ICD-10-CM

## 2021-01-06 DIAGNOSIS — R0683 Snoring: Secondary | ICD-10-CM | POA: Diagnosis not present

## 2021-01-06 DIAGNOSIS — R519 Headache, unspecified: Secondary | ICD-10-CM | POA: Diagnosis not present

## 2021-01-06 DIAGNOSIS — Z9189 Other specified personal risk factors, not elsewhere classified: Secondary | ICD-10-CM

## 2021-01-06 DIAGNOSIS — R258 Other abnormal involuntary movements: Secondary | ICD-10-CM

## 2021-01-06 DIAGNOSIS — R351 Nocturia: Secondary | ICD-10-CM

## 2021-01-06 NOTE — Progress Notes (Signed)
Subjective:    Patient Melissa White: Melissa White is a 45 y.o. female.  HPI     Melissa Age, MD, PhD [facility] [address] [place], [place] [address]  Dear Dr. Lorelei White,   I saw your patient, Scarleth White, upon your kind request in my sleep clinic today for initial consultation of her sleep disorder, in particular, concern for underlying obstructive sleep apnea.  The patient is unaccompanied today.  As you know, Melissa White is a 45 year old right-handed female with an underlying medical history of allergies, anemia, fibroids, HIV disease, paresthesias (followed by Dr. Posey White at [facility]), bilateral CTS, and severe obesity with a BMI of over 40, who reports snoring and morning headaches.  Her snoring is loud per husband's feedback, she reports that he has even recorded her. She does not always wake up fully rested but tries to get enough sleep.  Her sister has sleep apnea and has a CPAP machine.  I reviewed your office note from 11/21/2020.  Her Epworth sleepiness score is 5 out of 24 today, fatigue severity score is 23 out of 63.  She lives with her husband and youngest son, 64, she has 2 older sons, ages 20 and 57.  She has woken up with a headache at times, she sometimes takes ibuprofen, headaches have improved in the past approximately 3 months.  She has nocturia about twice per average night.  Bedtime is generally between 9 and 10 PM and rise time typically around 5:30 AM.  She works as a Quarry manager at [employer].  She drinks caffeine in the form of soda, 1/day on average.  She is working on weight loss.  She was recently diagnosed with carpal tunnel syndrome.  She is a non-smoker and does not drink any alcohol currently.  She is followed regularly by Melissa White.   Her Past Medical History Is Significant For: Past Medical History:  Diagnosis Date  . Allergy   . Anemia   . Fibroids   . HIV (human immunodeficiency virus infection) ([place])      Her Past Surgical History Is Significant For: Past Surgical History:  Procedure Laterality Date  . CESAREAN SECTION     2  . IR RADIOLOGIST EVAL & MGMT  12/21/2018    Her Family History Is Significant For: Family History  Problem Relation White of Onset  . Diabetes Mother   . Arthritis Mother   . Hypertension Mother   . Kidney disease Father   . Hypertension Father     Her Social History Is Significant For: Social History   Socioeconomic History  . Marital status: Married    Spouse name: Not on file  . Number of children: 3  . Years of education: Not on file  . Highest education level: Not on file  Occupational History  . Not on file  Tobacco Use  . Smoking status: Never Smoker  . Smokeless tobacco: Never Used  Substance and Sexual Activity  . Alcohol use: No  . Drug use: No  . Sexual activity: Not Currently    Birth control/protection: Condom  Other Topics Concern  . Not on file  Social History Narrative   Right handed    Lives in a one story home    Has three sons    Drinks Caffeine every once in awhile    Social Determinants of Health   Financial Resource Strain: Not on file  Food Insecurity: Not on file  Transportation Needs: Not  on file  Physical Activity: Not on file  Stress: Not on file  Social Connections: Not on file    Her Allergies Are:  No Known Allergies:   Her Current Medications Are:  Outpatient Encounter Medications as of 01/06/2021  Medication Sig  . BIKTARVY 50-200-25 MG TABS tablet TK 1 T PO  QD  . cetirizine (ZYRTEC) 10 MG tablet Take 1 tablet (10 mg total) by mouth daily.  . ferrous sulfate 325 (65 FE) MG tablet Take 325 mg by mouth daily with breakfast.  . hydrocortisone cream 1 % Apply to affected area 2 times daily  . omeprazole (PRILOSEC) 40 MG capsule Take 1 capsule (40 mg total) by mouth daily.  Marland Kitchen ORIAHNN 300-1-0.5 & 300 MG CPPK Take 1 capsule by mouth 2 (two) times daily.  . valACYclovir (VALTREX) 500 MG tablet TAKE 1  TABLET BY MOUTH DAILY   No facility-administered encounter medications on file as of 01/06/2021.  :   Review of Systems:  Out of a complete 14 point review of systems, all are reviewed and negative with the exception of these symptoms as listed below:  Review of Systems  Neurological:       Here for sleep consult. No prior sleep study. Reports hx of daily h/a and snoring at night.  Epworth Sleepiness Scale 0= would never doze 1= slight chance of dozing 2= moderate chance of dozing 3= high chance of dozing  Sitting and reading:2 Watching TV:2 Sitting inactive in a public place (ex. Theater or meeting):0 As a passenger in a car for an hour without a break:0 Lying down to rest in the afternoon:1 Sitting and talking to someone:0 Sitting quietly after lunch (no alcohol):0 In a car, while stopped in traffic:0 Total:5     Objective:  Neurological Exam  Physical Exam Physical Examination:   Vitals:   01/06/21 0901  BP: 132/84  Pulse: (!) 104  SpO2: 97%    General Examination: The patient is a very pleasant 45 y.o. female in no acute distress. She appears well-developed and well-nourished and well groomed.   HEENT: Normocephalic, atraumatic, pupils are equal, round and reactive to light, extraocular tracking is good without limitation to gaze excursion or nystagmus noted. Hearing is grossly intact. Face is symmetric with normal facial animation. Speech is clear with no dysarthria noted. There is no hypophonia. There is no lip, neck/head, jaw or voice tremor. Neck is supple with full range of passive and active motion. There are no carotid bruits on auscultation. Oropharynx exam reveals: mild mouth dryness, good dental hygiene and moderate airway crowding, due to small airway.  Tonsils are about 1+ bilaterally.  Mallampati class III.  Neck circumference of 17-5/8 inches.  She has a moderate overbite.  Tongue protrudes centrally and palate elevates symmetrically.  Chest: Clear to  auscultation without wheezing, rhonchi or crackles noted.  Heart: S1+S2+0, regular and normal without murmurs, rubs or gallops noted.   Abdomen: Soft, non-tender and non-distended with normal bowel sounds appreciated on auscultation.  Extremities: There is no pitting edema in the distal lower extremities bilaterally.   Skin: Warm and dry without trophic changes noted.   Musculoskeletal: exam reveals no obvious joint deformities, tenderness or joint swelling or erythema.   Neurologically:  Mental status: The patient is awake, alert and oriented in all 4 spheres. Her immediate and remote memory, attention, language skills and fund of knowledge are appropriate. There is no evidence of aphasia, agnosia, apraxia or anomia. Speech is clear with normal prosody and  enunciation. Thought process is linear. Mood is normal and affect is normal.  Cranial nerves II - XII are as described above under HEENT exam.  Motor exam: Normal bulk, strength and tone is noted. There is no tremor, Romberg is negative. Fine motor skills and coordination: grossly intact.  Cerebellar testing: No dysmetria or intention tremor. There is no truncal or gait ataxia.  Sensory exam: intact to light touch in the upper and lower extremities.  Gait, station and balance: She stands easily. No veering to one side is noted. No leaning to one side is noted. Posture is White-appropriate and stance is narrow based. Gait shows normal stride length and normal pace. No problems turning are noted. Tandem walk is unremarkable.                Assessment and Plan:   In summary, Amorette Charrette is a very pleasant 45 y.o.-year old female with an underlying medical history of allergies, anemia, fibroids, HIV disease, paresthesias (followed by Dr. Posey White at [facility] neurology), bilateral CTS, and severe obesity with a BMI of over 86, whose history and physical exam are concerning for obstructive sleep apnea (OSA). I had a long chat with the patient about  my findings and the diagnosis of OSA, its prognosis and treatment options. We talked about medical treatments, surgical interventions and non-pharmacological approaches. I explained in particular the risks and ramifications of untreated moderate to severe OSA, especially with respect to developing cardiovascular disease down the Road, including congestive heart failure, difficult to treat hypertension, cardiac arrhythmias, or stroke. Even type 2 diabetes has, in part, been linked to untreated OSA. Symptoms of untreated OSA include daytime sleepiness, memory problems, mood irritability and mood disorder such as depression and anxiety, lack of energy, as well as recurrent headaches, especially morning headaches. We talked about trying to maintain a healthy lifestyle in general, as well as the importance of weight control. We also talked about the importance of good sleep hygiene. She is encouraged to talk to you about a weight management referral, such as healthy weight and wellness. I recommended the following at this time: sleep study.   I explained the sleep test procedure to the patient and also outlined possible surgical and non-surgical treatment options of OSA, including the use of a custom-made dental device (which would require a referral to a specialist dentist or oral surgeon), upper airway surgical options, such as traditional UPPP or a novel less invasive surgical option in the form of Inspire hypoglossal nerve stimulation (which would involve a referral to an ENT surgeon). I also explained the CPAP treatment option to the patient, who indicated that she would be willing to try CPAP if the need arises. I explained the importance of being compliant with PAP treatment, not only for insurance purposes but primarily to improve Her symptoms, and for the patient's long term health benefit, including to reduce Her cardiovascular risks. I answered all her questions today and the patient was in agreement. I  plan to see her back after the sleep study is completed and encouraged her to call with any interim questions, concerns, problems or updates.   Thank you very much for allowing me to participate in the care of this nice patient. If I can be of any further assistance to you please do not hesitate to call me at [phone].  Sincerely,   Melissa Age, MD, PhD

## 2021-01-06 NOTE — Patient Instructions (Signed)

## 2021-01-09 ENCOUNTER — Telehealth: Payer: Self-pay

## 2021-01-09 NOTE — Telephone Encounter (Signed)
Left voicemail to call to schedule home sleep test.

## 2021-01-10 DIAGNOSIS — N921 Excessive and frequent menstruation with irregular cycle: Secondary | ICD-10-CM | POA: Diagnosis not present

## 2021-01-10 DIAGNOSIS — D251 Intramural leiomyoma of uterus: Secondary | ICD-10-CM | POA: Diagnosis not present

## 2021-01-10 DIAGNOSIS — D500 Iron deficiency anemia secondary to blood loss (chronic): Secondary | ICD-10-CM | POA: Diagnosis not present

## 2021-01-10 DIAGNOSIS — Z3202 Encounter for pregnancy test, result negative: Secondary | ICD-10-CM | POA: Diagnosis not present

## 2021-01-14 ENCOUNTER — Telehealth: Payer: Self-pay

## 2021-01-14 NOTE — Telephone Encounter (Signed)
LVM for pt to call me back to schedule sleep study  

## 2021-01-21 DIAGNOSIS — N921 Excessive and frequent menstruation with irregular cycle: Secondary | ICD-10-CM | POA: Diagnosis not present

## 2021-01-21 DIAGNOSIS — D219 Benign neoplasm of connective and other soft tissue, unspecified: Secondary | ICD-10-CM | POA: Diagnosis not present

## 2021-01-21 DIAGNOSIS — D509 Iron deficiency anemia, unspecified: Secondary | ICD-10-CM | POA: Diagnosis not present

## 2021-01-21 DIAGNOSIS — M545 Low back pain, unspecified: Secondary | ICD-10-CM | POA: Diagnosis not present

## 2021-01-24 ENCOUNTER — Encounter: Payer: Self-pay | Admitting: Orthopaedic Surgery

## 2021-01-24 ENCOUNTER — Ambulatory Visit (INDEPENDENT_AMBULATORY_CARE_PROVIDER_SITE_OTHER): Payer: BC Managed Care – PPO | Admitting: Orthopaedic Surgery

## 2021-01-24 DIAGNOSIS — G5603 Carpal tunnel syndrome, bilateral upper limbs: Secondary | ICD-10-CM

## 2021-01-24 MED ORDER — PREDNISONE 10 MG (21) PO TBPK: ORAL_TABLET | ORAL | 0 refills | Status: DC

## 2021-01-24 NOTE — Progress Notes (Signed)
Office Visit Note   Patient: Melissa White           Date of Birth: 1976-02-27           MRN: [id number] Visit Date: 01/24/2021              Requested by: Alda Berthold, DO [place] [address],  [place] [address] PCP: Darreld Mclean, MD   Assessment & Plan: Visit Diagnoses:  1. Bilateral carpal tunnel syndrome     Plan: Impression is bilateral hand carpal tunnel syndrome severe in nature.  We have discussed various treatment options to include steroid pack versus cortisone injection versus surgical intervention which I recommended based on the severity of compression.  She notes that she is currently dealing with a gynecological issue which will need surgical intervention.  She is scheduled to see that doctor this coming Monday.  When she is able to proceed with carpal tunnel release, she will follow up with [place].  In the meantime, agreed to call in a steroid taper and provide her with a removable wrist splints to wear at night.  Call with concerns or questions in the meantime.  Follow-Up Instructions: Return if symptoms worsen or fail to improve.   Orders:  No orders of the defined types were placed in this encounter.  Meds ordered this encounter  Medications  . predniSONE (STERAPRED UNI-PAK 21 TAB) 10 MG (21) TBPK tablet    Sig: Take as directed    Dispense:  21 tablet    Refill:  0      Procedures: No procedures performed   Clinical Data: No additional findings.   Subjective: Chief Complaint  Patient presents with  . Right Hand - Pain  . Left Hand - Pain    HPI patient is a very pleasant 45 year old right-hand-dominant female who comes in today with bilateral carpal tunnel syndrome left greater than right.  She has been dealing with these symptoms for many years but has recently worsened.  She has previously worked as a Information systems manager.  Recent nerve conduction studies of bilateral upper extremity show severe compression of the median  nerve right greater than left.  Her symptoms are to the thumb, index, long and ring fingers.  She denies any weakness.  Her symptoms do appear to be worse at night.  she has not previously tried bracing or injections.   Review of Systems as detailed in HPI.  All others reviewed and are negative.   Objective: Vital Signs: There were no vitals taken for this visit.  Physical Exam well-developed well-nourished female no acute distress.  Alert oriented x3.  Ortho Exam bilateral hand exam shows negative Phalen.  Positive Tinel both sides.  No thenar atrophy.  Full grip strength.  She is neurovascular intact distally.  Specialty Comments:  No specialty comments available.  Imaging: No new imaging   PMFS History: Patient Active Problem List   Diagnosis Date Noted  . Low back pain 08/21/2019  . Costochondritis 08/08/2019  . HIV positive ([place]) 06/16/2017  . Menstrual cramps 06/16/2017   Past Medical History:  Diagnosis Date  . Allergy   . Anemia   . Fibroids   . HIV (human immunodeficiency virus infection) ([place])     Family History  Problem Relation Age of Onset  . Diabetes Mother   . Arthritis Mother   . Hypertension Mother   . Kidney disease Father   . Hypertension Father     Past Surgical  History:  Procedure Laterality Date  . CESAREAN SECTION     2  . IR RADIOLOGIST EVAL & MGMT  12/21/2018   Social History   Occupational History  . Not on file  Tobacco Use  . Smoking status: Never Smoker  . Smokeless tobacco: Never Used  Substance and Sexual Activity  . Alcohol use: No  . Drug use: No  . Sexual activity: Not Currently    Birth control/protection: Condom

## 2021-01-27 DIAGNOSIS — Z3202 Encounter for pregnancy test, result negative: Secondary | ICD-10-CM | POA: Diagnosis not present

## 2021-01-27 DIAGNOSIS — D500 Iron deficiency anemia secondary to blood loss (chronic): Secondary | ICD-10-CM | POA: Diagnosis not present

## 2021-01-27 DIAGNOSIS — D251 Intramural leiomyoma of uterus: Secondary | ICD-10-CM | POA: Diagnosis not present

## 2021-01-27 DIAGNOSIS — N921 Excessive and frequent menstruation with irregular cycle: Secondary | ICD-10-CM | POA: Diagnosis not present

## 2021-02-10 ENCOUNTER — Ambulatory Visit (INDEPENDENT_AMBULATORY_CARE_PROVIDER_SITE_OTHER): Payer: BC Managed Care – PPO | Admitting: Neurology

## 2021-02-10 DIAGNOSIS — R519 Headache, unspecified: Secondary | ICD-10-CM

## 2021-02-10 DIAGNOSIS — R258 Other abnormal involuntary movements: Secondary | ICD-10-CM

## 2021-02-10 DIAGNOSIS — R351 Nocturia: Secondary | ICD-10-CM

## 2021-02-10 DIAGNOSIS — Z820 Family history of epilepsy and other diseases of the nervous system: Secondary | ICD-10-CM

## 2021-02-10 DIAGNOSIS — Z9189 Other specified personal risk factors, not elsewhere classified: Secondary | ICD-10-CM

## 2021-02-10 DIAGNOSIS — G4733 Obstructive sleep apnea (adult) (pediatric): Secondary | ICD-10-CM | POA: Diagnosis not present

## 2021-02-10 DIAGNOSIS — R0683 Snoring: Secondary | ICD-10-CM

## 2021-02-18 NOTE — Progress Notes (Signed)
See procedure note.

## 2021-02-19 ENCOUNTER — Other Ambulatory Visit: Payer: Self-pay

## 2021-02-19 ENCOUNTER — Ambulatory Visit (INDEPENDENT_AMBULATORY_CARE_PROVIDER_SITE_OTHER): Payer: BC Managed Care – PPO | Admitting: Family Medicine

## 2021-02-19 ENCOUNTER — Encounter: Payer: Self-pay | Admitting: Family Medicine

## 2021-02-19 VITALS — BP 128/78 | HR 84 | Temp 98.3°F | Ht 63.0 in | Wt 242.4 lb

## 2021-02-19 DIAGNOSIS — N939 Abnormal uterine and vaginal bleeding, unspecified: Secondary | ICD-10-CM | POA: Diagnosis not present

## 2021-02-19 DIAGNOSIS — T7840XA Allergy, unspecified, initial encounter: Secondary | ICD-10-CM | POA: Diagnosis not present

## 2021-02-19 LAB — CBC
HCT: 36.7 % (ref 36.0–46.0)
Hemoglobin: 12.3 g/dL (ref 12.0–15.0)
MCHC: 33.4 g/dL (ref 30.0–36.0)
MCV: 83.5 fl (ref 78.0–100.0)
Platelets: 258 10*3/uL (ref 150.0–400.0)
RBC: 4.39 Mil/uL (ref 3.87–5.11)
RDW: 14.3 % (ref 11.5–15.5)
WBC: 4.1 10*3/uL (ref 4.0–10.5)

## 2021-02-19 MED ORDER — METHYLPREDNISOLONE ACETATE 80 MG/ML IJ SUSP
80.0000 mg | Freq: Once | INTRAMUSCULAR | Status: AC
  Administered 2021-02-19: 80 mg via INTRAMUSCULAR

## 2021-02-19 NOTE — Patient Instructions (Addendum)
Continue with the iron.  Give [place] 2-3 business days to get the results of your labs back.   Continue Zyrtec for 7 days.  Please stop the Megace. I will be checking with the pharmacy team to see if an alternative progesterone would be safe. If it is, I will send it in to use for 10 days. If not, I will hold off unless hemoglobin is low.   Let us know if you need anything.

## 2021-02-19 NOTE — Progress Notes (Signed)
Chief Complaint  Patient presents with  . Rash    Melissa White is a 45 y.o. female here for an allergic reaction.  Duration: 2 weeks  She had itching over her entire body and hives in her upper chest and arms.  It resolved when she stopped taking the Megace and returned after she took 2 extra doses Any new medications, lotions, soaps, topicals or detergents? Yes - Megace ACEi/ARB/Estrogen? No Hx of allergic rxn/angioedema/anaphylaxis? No she specifically denies shortness of breath, tongue or lip swelling, or swelling in the throat.  The patient has been dealing with abnormal uterine bleeding.  Her gynecologist placed her on Megace to help with this.  While it did help with the bleeding, it caused the above.  She has a surgical procedure on 5/24.  She has a history of anemia.  She is still taking iron.  She does not feel dizzy or lightheaded.  Past Medical History:  Diagnosis Date  . Allergy   . Anemia   . Fibroids   . HIV (human immunodeficiency virus infection) ([place])     Family History  Problem Relation Age of Onset  . Diabetes Mother   . Arthritis Mother   . Hypertension Mother   . Kidney disease Father   . Hypertension Father     BP 128/78 (BP Location: Left Arm, Patient Position: Sitting, Cuff Size: Large)   Pulse 84   Temp 98.3 F (36.8 C) (Oral)   Ht 5\' 3"  (1.6 m)   Wt 242 lb 6 oz (109.9 kg)   SpO2 96%   BMI 42.93 kg/m  General: Well appearing, appearing stated age, well-nourished, awake HEENT: Ears are patent, TM's negative, Nose patent without discharge, MMM, tongue without deviation or edema, uvula without edema, pharynx without erythema or petechiae; Neck without masses, edema or asymmetry Heart: RRR Lungs: CTAB, no rales or stridor, normal respiratory effort without accessory muscle use Skin: Exposed skin is warm and dry without lesion Psych: Age appropriate judgment and insight, normal affect and mood  Allergic reaction, initial encounter  Abnormal  uterine bleeding (AUB) - Plan: CBC  Exacerbation of chronic condition AUB. Check CBC, stop Megace.  Take Zyrtec daily for the next 7 days.  If CBC normal, will wait until surgery.  If well, will discuss taking Megace to MPA.  Discussed w pharmacy team who noted anaphylaxis risk is very low, but changes of having the same reaction to MPA as she did megace is present. Pt informed to seek emergent care if starting to experience SOB, swelling with tongue or airway/neck.  F/u prn. The patient voiced understanding and agreement to the plan.  Edwards, DO 02/19/21 10:00 AM

## 2021-02-24 ENCOUNTER — Telehealth: Payer: Self-pay | Admitting: Neurology

## 2021-02-24 NOTE — Telephone Encounter (Signed)
I called pt. I advised pt that Dr. Rexene Alberts reviewed their sleep study results and found that pt has severe sleep apnea. Dr. Rexene Alberts recommends that pt starts auto CPAP 7-13 cm water pressure. I reviewed PAP compliance expectations with the pt. Pt is agreeable to starting a CPAP. I advised pt that an order will be sent to a DME, [facility] ([facility] and [facility] ([place]) will call the pt within about one week after they file with the pt's insurance. [facility]) will show the pt how to use the machine, fit for masks, and troubleshoot the CPAP if needed. A follow up appt was made for insurance purposes with Dr. Rexene Alberts on Sept 12,2022 at 9:30 am. Pt verbalized understanding to arrive 15 minutes early and bring their CPAP. A letter with all of this information in it will be mailed to the pt as a reminder. I verified with the pt that the address we have on file is correct. Pt verbalized understanding of results. Pt had no questions at this time but was encouraged to call back if questions arise. I have sent the order to [facility]) and have received confirmation that they have received the order.

## 2021-02-24 NOTE — Telephone Encounter (Signed)
-----   Message from Star Age, MD sent at 02/24/2021  8:32 AM EDT ----- Patient referred by Dr. Lorelei Pont, seen by me on 01/06/21, patient had a HST on 02/10/21.    Please call and notify the patient that the recent home sleep test showed obstructive sleep apnea in the severe range. I recommend treatment in the form of autoPAP, which means, that we don't have to bring her in for a sleep study with CPAP, but will let her start using a so called autoPAP machine at home, which is a CPAP-like machine with self-adjusting pressures. We will send the order to a local DME company (of her choice, or as per insurance requirement). The DME representative will fit her with a mask, educate her on how to use the machine, how to put the mask on, etc. I have placed an order in the chart. Please send the order, talk to patient, send report to referring MD. We will need a FU in sleep clinic for 10 weeks post-PAP set up, please arrange that with me or one of our NPs. Also reinforce the need for compliance with treatment. Thanks,   Star Age, MD, PhD [facility])

## 2021-02-24 NOTE — Progress Notes (Signed)
Patient referred by Dr. Lorelei Pont, seen by me on 01/06/21, patient had a HST on 02/10/21.    Please call and notify the patient that the recent home sleep test showed obstructive sleep apnea in the severe range. I recommend treatment in the form of autoPAP, which means, that we don't have to bring her in for a sleep study with CPAP, but will let her start using a so called autoPAP machine at home, which is a CPAP-like machine with self-adjusting pressures. We will send the order to a local DME company (of her choice, or as per insurance requirement). The DME representative will fit her with a mask, educate her on how to use the machine, how to put the mask on, etc. I have placed an order in the chart. Please send the order, talk to patient, send report to referring MD. We will need a FU in sleep clinic for 10 weeks post-PAP set up, please arrange that with me or one of our NPs. Also reinforce the need for compliance with treatment. Thanks,   Star Age, MD, PhD [facility])

## 2021-02-24 NOTE — Procedures (Signed)
[facility] TEST (Watch PAT)  STUDY DATE: 02/10/21  DOB: [birth year]  MRN: [id number]  ORDERING CLINICIAN: Star Age, MD, PhD   REFERRING CLINICIAN: Copland, Gay Filler, MD   CLINICAL INFORMATION/HISTORY: 45 year old female with a history of allergies, anemia, fibroids, HIV disease, paresthesias, carpal tunnel, and severe obesity with a BMI of over 40, who reports snoring and morning headaches.   Epworth sleepiness score: 5/24.  BMI: 43.58 kg/m  Neck Cirumference: 17-5/8 "  FINDINGS:   Total Record Time (hours, min): 9 H 26 min  Total Sleep Time (hours, min):  7 H 43 min   Percent REM (%):    17.14 %   Calculated pAHI (per hour): 65.6       REM pAHI: 85.3   NREM pAHI: 62.0 Supine AHI: 84.0   Oxygen Saturation (%) Mean: 97  Minimum oxygen saturation (%):        81   O2 Saturation Range (%): 81-100  O2Saturation (minutes) <=88%: 0.2 min  Pulse Mean (bpm):    77  Pulse Range (58-111)   IMPRESSION: OSA (obstructive sleep apnea), severe   RECOMMENDATION:  This home sleep test demonstrates severe obstructive sleep apnea with a total AHI of 65.6/hour and O2 nadir of 81%. Snoring was noted and appeared to be in the moderate to loud range. Treatment with positive airway pressure is recommended. The patient will be advised to proceed with an autoPAP titration/trial at home for now. A full night titration study may be considered to optimize treatment settings, if needed down the road. Please note that untreated obstructive sleep apnea may carry additional perioperative morbidity. Patients with significant obstructive sleep apnea should receive perioperative PAP therapy and the surgeons and particularly the anesthesiologist should be informed of the diagnosis and the severity of the sleep disordered breathing. The patient should be cautioned not to drive, work at heights, or operate dangerous or heavy equipment when tired or sleepy. Review and reiteration of  good sleep hygiene measures should be pursued with any patient. Other causes of the patient's symptoms, including circadian rhythm disturbances, an underlying mood disorder, medication effect and/or an underlying medical problem cannot be ruled out based on this test. Clinical correlation is recommended. The patient and her referring provider will be notified of the test results. The patient will be seen in follow up in sleep clinic at [facility].  I certify that I have reviewed the raw data recording prior to the issuance of this report in accordance with the standards of the American Academy of Sleep Medicine (AASM).  INTERPRETING PHYSICIAN:  Star Age, MD, PhD  Board Certified in Neurology and Sleep Medicine  [facility] [address], [place], [place] [address] [phone]   Sleep Summary  Oxygen Saturation Statistics   Start Study Time: End Study Time: Total Recording Time:  8:08:38 PM 5:35:33 AM 9 h, 26 min  Total Sleep Time % REM of Sleep Time:  7 h, 43 min  17.1    Mean: 97 Minimum: 81 Maximum: 100  Mean of Desaturations Nadirs (%):   93  Oxygen Desatur. %:  4-9 10-20 >20 Total  Events Number Total   226  54 80.7 19.3  0 0.0  280 100.0  Oxygen Saturation: <90 <=88 <85 <80 <70  Duration (minutes): Sleep % 3.6 0.8 2.0 0.2 0.4 0.0 0.0 0.0 0.0 0.0     Respiratory Indices      Total Events REM NREM All Night  pRDI: pAHI 3%: ODI 4%: pAHIc 3%: % CSR: pAHI 4%:  435  427 280  65 0.0 342 82.3 82.3 58.3 8.6 63.5 62.0 39.7 10.3 66.8 65.6 43.0 10.0 52.6       Pulse Rate Statistics during Sleep (BPM)      Mean: 77 Minimum: 58 Maximum: 111      Body Position Statistics  Position Supine Prone Right Left Non-Supine  Sleep (min) 232.9 0.0 0.0 231.0 231.0  Sleep % 50.2 0.0 0.0 49.8 49.8  pRDI 85.9 N/A N/A 49.5 49.5  pAHI 3% 84.0 N/A N/A 48.9 48.9  ODI 4% 65.2 N/A N/A 22.9 22.9     Snoring Statistics Snoring Level (dB) >40  >50 >60 >70 >80 >Threshold (45)  Sleep (min) 316.5 97.2 4.2 0.0 0.0 169.1  Sleep % 68.2 21.0 0.9 0.0 0.0 36.5    Mean: 45 dB

## 2021-02-24 NOTE — Addendum Note (Signed)
Addended by: Star Age on: 02/24/2021 08:32 AM   Modules accepted: Orders

## 2021-02-28 DIAGNOSIS — D219 Benign neoplasm of connective and other soft tissue, unspecified: Secondary | ICD-10-CM | POA: Diagnosis not present

## 2021-03-01 ENCOUNTER — Encounter (HOSPITAL_BASED_OUTPATIENT_CLINIC_OR_DEPARTMENT_OTHER): Payer: Self-pay | Admitting: Emergency Medicine

## 2021-03-01 ENCOUNTER — Observation Stay (HOSPITAL_BASED_OUTPATIENT_CLINIC_OR_DEPARTMENT_OTHER)
Admission: EM | Admit: 2021-03-01 | Discharge: 2021-03-02 | Disposition: A | Discharge status: Home / Self Care | Payer: BC Managed Care – PPO | Attending: Obstetrics & Gynecology | Admitting: Obstetrics & Gynecology

## 2021-03-01 ENCOUNTER — Other Ambulatory Visit: Payer: Self-pay

## 2021-03-01 DIAGNOSIS — D649 Anemia, unspecified: Secondary | ICD-10-CM | POA: Diagnosis present

## 2021-03-01 DIAGNOSIS — Z20822 Contact with and (suspected) exposure to covid-19: Secondary | ICD-10-CM | POA: Insufficient documentation

## 2021-03-01 DIAGNOSIS — N939 Abnormal uterine and vaginal bleeding, unspecified: Secondary | ICD-10-CM

## 2021-03-01 DIAGNOSIS — D259 Leiomyoma of uterus, unspecified: Secondary | ICD-10-CM | POA: Diagnosis not present

## 2021-03-01 DIAGNOSIS — R531 Weakness: Secondary | ICD-10-CM | POA: Diagnosis not present

## 2021-03-01 DIAGNOSIS — D62 Acute posthemorrhagic anemia: Principal | ICD-10-CM | POA: Insufficient documentation

## 2021-03-01 DIAGNOSIS — B20 Human immunodeficiency virus [HIV] disease: Secondary | ICD-10-CM | POA: Diagnosis not present

## 2021-03-01 DIAGNOSIS — Z79899 Other long term (current) drug therapy: Secondary | ICD-10-CM | POA: Insufficient documentation

## 2021-03-01 LAB — CBC WITH DIFFERENTIAL/PLATELET
Abs Immature Granulocytes: 0.07 10*3/uL (ref 0.00–0.07)
Basophils Absolute: 0 10*3/uL (ref 0.0–0.1)
Basophils Relative: 0 %
Eosinophils Absolute: 0.2 10*3/uL (ref 0.0–0.5)
Eosinophils Relative: 2 %
HCT: 18.2 % — ABNORMAL LOW (ref 36.0–46.0)
Hemoglobin: 6.1 g/dL — CL (ref 12.0–15.0)
Immature Granulocytes: 1 %
Lymphocytes Relative: 24 %
Lymphs Abs: 1.9 10*3/uL (ref 0.7–4.0)
MCH: 28.2 pg (ref 26.0–34.0)
MCHC: 33.5 g/dL (ref 30.0–36.0)
MCV: 84.3 fL (ref 80.0–100.0)
Monocytes Absolute: 0.6 10*3/uL (ref 0.1–1.0)
Monocytes Relative: 7 %
Neutro Abs: 5.2 10*3/uL (ref 1.7–7.7)
Neutrophils Relative %: 66 %
Platelets: 245 10*3/uL (ref 150–400)
RBC: 2.16 MIL/uL — ABNORMAL LOW (ref 3.87–5.11)
RDW: 14.7 % (ref 11.5–15.5)
WBC: 7.9 10*3/uL (ref 4.0–10.5)
nRBC: 1.1 % — ABNORMAL HIGH (ref 0.0–0.2)

## 2021-03-01 LAB — BASIC METABOLIC PANEL
Anion gap: 8 (ref 5–15)
BUN: 12 mg/dL (ref 6–20)
CO2: 22 mmol/L (ref 22–32)
Calcium: 8 mg/dL — ABNORMAL LOW (ref 8.9–10.3)
Chloride: 108 mmol/L (ref 98–111)
Creatinine, Ser: 0.8 mg/dL (ref 0.44–1.00)
GFR, Estimated: >60 mL/min (ref >60)
Glucose, Bld: 118 mg/dL — ABNORMAL HIGH (ref 70–99)
Potassium: 3.4 mmol/L — ABNORMAL LOW (ref 3.5–5.1)
Sodium: 138 mmol/L (ref 135–145)

## 2021-03-01 LAB — PREPARE RBC (CROSSMATCH)

## 2021-03-01 LAB — RESP PANEL BY RT-PCR (FLU A&B, COVID) ARPGX2
Influenza A by PCR: NEGATIVE
Influenza B by PCR: NEGATIVE
SARS Coronavirus 2 by RT PCR: NEGATIVE

## 2021-03-01 LAB — ABO/RH: ABO/RH(D): O POS

## 2021-03-01 MED ORDER — ZOLPIDEM TARTRATE 5 MG PO TABS: 5.0000 mg | ORAL_TABLET | Freq: Every evening | ORAL | Status: DC | PRN

## 2021-03-01 MED ORDER — SODIUM CHLORIDE 0.9 % IV SOLN: INTRAVENOUS | Status: DC

## 2021-03-01 MED ORDER — PRENATAL MULTIVITAMIN CH
1.0000 | ORAL_TABLET | Freq: Every day | ORAL | Status: DC
  Administered 2021-03-01 – 2021-03-02 (×2): 1 via ORAL
  Filled 2021-03-01 (×2): qty 1

## 2021-03-01 MED ORDER — LACTATED RINGERS IV BOLUS
1000.0000 mL | Freq: Once | INTRAVENOUS | Status: AC
  Administered 2021-03-01: 1000 mL via INTRAVENOUS

## 2021-03-01 MED ORDER — BUTALBITAL-APAP-CAFFEINE 50-325-40 MG PO TABS
2.0000 | ORAL_TABLET | Freq: Four times a day (QID) | ORAL | Status: DC | PRN
  Administered 2021-03-01: 2 via ORAL
  Filled 2021-03-01: qty 2

## 2021-03-01 MED ORDER — BICTEGRAVIR-EMTRICITAB-TENOFOV 50-200-25 MG PO TABS
1.0000 | ORAL_TABLET | Freq: Every day | ORAL | Status: DC
  Administered 2021-03-01 – 2021-03-02 (×2): 1 via ORAL
  Filled 2021-03-01 (×2): qty 1

## 2021-03-01 MED ORDER — VALACYCLOVIR HCL 500 MG PO TABS
500.0000 mg | ORAL_TABLET | Freq: Every day | ORAL | Status: DC
  Administered 2021-03-01 – 2021-03-02 (×2): 500 mg via ORAL
  Filled 2021-03-01 (×2): qty 1

## 2021-03-01 MED ORDER — MEDROXYPROGESTERONE ACETATE 10 MG PO TABS
10.0000 mg | ORAL_TABLET | Freq: Every day | ORAL | Status: DC
  Administered 2021-03-01 – 2021-03-02 (×2): 10 mg via ORAL
  Filled 2021-03-01 (×2): qty 1

## 2021-03-01 MED ORDER — LORATADINE 10 MG PO TABS
10.0000 mg | ORAL_TABLET | Freq: Every day | ORAL | Status: DC
  Administered 2021-03-01 – 2021-03-02 (×2): 10 mg via ORAL
  Filled 2021-03-01 (×2): qty 1

## 2021-03-01 MED ORDER — IBUPROFEN 600 MG PO TABS
600.0000 mg | ORAL_TABLET | Freq: Four times a day (QID) | ORAL | Status: DC | PRN
  Administered 2021-03-01 – 2021-03-02 (×2): 600 mg via ORAL
  Filled 2021-03-01 (×2): qty 1

## 2021-03-01 MED ORDER — SODIUM CHLORIDE 0.9% IV SOLUTION: Freq: Once | INTRAVENOUS | Status: DC

## 2021-03-01 NOTE — ED Notes (Signed)
Phone Handoff report given to Alexis-RN at [facility] main campus, [address]

## 2021-03-01 NOTE — ED Triage Notes (Signed)
Patient presents with complaints of weakness; states heavy vaginal bleeding; hgb 6.2 yesterday; received no call from pcp in regards to this. Has been on multiple meds for increased vag bleeding that has been ongoing since December of 2021.

## 2021-03-01 NOTE — H&P (Signed)
Melissa White is an 45 y.o. female. No obstetric history on file. No LMP recorded. (Menstrual status: Irregular Periods). She presented to the ED with weakness, with prolonged DUB since December, followed by Dr. Dema Severin at [facility]. She was using Megace but stopped recently and her bleeding in worse. Sent to receive transfusion for sx anemia and she consents.   Pertinent Gynecological History:  Bleeding: dysfunctional uterine bleeding Contraception: none Blood transfusions: none  Menstrual History:  No LMP recorded. (Menstrual status: Irregular Periods).    Past Medical History:  Diagnosis Date  . Allergy   . Anemia   . Fibroids   . HIV (human immunodeficiency virus infection) ([place])     Past Surgical History:  Procedure Laterality Date  . CESAREAN SECTION     2  . IR RADIOLOGIST EVAL & MGMT  12/21/2018    Family History  Problem Relation Age of Onset  . Diabetes Mother   . Arthritis Mother   . Hypertension Mother   . Kidney disease Father   . Hypertension Father     Social History:  reports that she has never smoked. She has never used smokeless tobacco. She reports that she does not drink alcohol and does not use drugs.  Allergies: No Known Allergies  Medications Prior to Admission  Medication Sig Dispense Refill Last Dose  . BIKTARVY 50-200-25 MG TABS tablet Take 1 tablet by mouth daily.  5 02/28/2021 at Unknown time  . cetirizine (ZYRTEC) 10 MG tablet Take 1 tablet (10 mg total) by mouth daily. 30 tablet 0 02/28/2021 at Unknown time  . ferrous sulfate 325 (65 FE) MG tablet Take 325 mg by mouth daily with breakfast.   02/28/2021 at Unknown time  . medroxyPROGESTERone (PROVERA) 10 MG tablet Take 10 mg by mouth daily.   02/28/2021 at Unknown time  . megestrol (MEGACE) 40 MG tablet Take 40 mg by mouth 2 (two) times daily.   02/28/2021 at Unknown time  . omeprazole (PRILOSEC) 40 MG capsule Take 1 capsule (40 mg total) by mouth daily. 30 capsule 3 02/28/2021 at Unknown time   . valACYclovir (VALTREX) 500 MG tablet Take 500 mg by mouth daily.   02/28/2021 at Unknown time  . hydrocortisone cream 1 % Apply to affected area 2 times daily (Patient not taking: No sig reported) 15 g 0 Not Taking at Unknown time    Review of Systems  Constitutional: Positive for fatigue.  Respiratory: Negative.   Cardiovascular: Negative.   Gastrointestinal: Negative.   Genitourinary: Positive for pelvic pain and vaginal bleeding.    Blood pressure 123/82, pulse (!) 109, temperature 98.4 F (36.9 C), temperature source Oral, resp. rate 18, height 5\' 3"  (1.6 m), weight 109.9 kg, SpO2 100 %. Physical Exam Vitals and nursing note reviewed.  Pulmonary:     Effort: Pulmonary effort is normal.  Abdominal:     General: Abdomen is flat.  Neurological:     Mental Status: She is alert.  Psychiatric:        Mood and Affect: Mood normal.        Behavior: Behavior normal.     Results for orders placed or performed during the hospital encounter of 03/01/21 (from the past 24 hour(s))  CBC with Differential/Platelet     Status: Abnormal   Collection Time: 03/01/21  1:50 AM  Result Value Ref Range   WBC 7.9 4.0 - 10.5 K/uL   RBC 2.16 (L) 3.87 - 5.11 MIL/uL   Hemoglobin 6.1 (LL) 12.0 - 15.0  g/dL   HCT 18.2 (L) 36.0 - 46.0 %   MCV 84.3 80.0 - 100.0 fL   MCH 28.2 26.0 - 34.0 pg   MCHC 33.5 30.0 - 36.0 g/dL   RDW 14.7 11.5 - 15.5 %   Platelets 245 150 - 400 K/uL   nRBC 1.1 (H) 0.0 - 0.2 %   Neutrophils Relative % 66 %   Neutro Abs 5.2 1.7 - 7.7 K/uL   Lymphocytes Relative 24 %   Lymphs Abs 1.9 0.7 - 4.0 K/uL   Monocytes Relative 7 %   Monocytes Absolute 0.6 0.1 - 1.0 K/uL   Eosinophils Relative 2 %   Eosinophils Absolute 0.2 0.0 - 0.5 K/uL   Basophils Relative 0 %   Basophils Absolute 0.0 0.0 - 0.1 K/uL   Immature Granulocytes 1 %   Abs Immature Granulocytes 0.07 0.00 - 0.07 K/uL  Basic metabolic panel     Status: Abnormal   Collection Time: 03/01/21  1:50 AM  Result Value Ref  Range   Sodium 138 135 - 145 mmol/L   Potassium 3.4 (L) 3.5 - 5.1 mmol/L   Chloride 108 98 - 111 mmol/L   CO2 22 22 - 32 mmol/L   Glucose, Bld 118 (H) 70 - 99 mg/dL   BUN 12 6 - 20 mg/dL   Creatinine, Ser 0.80 0.44 - 1.00 mg/dL   Calcium 8.0 (L) 8.9 - 10.3 mg/dL   GFR, Estimated >60 >60 mL/min   Anion gap 8 5 - 15  Resp Panel by RT-PCR (Flu A&B, Covid) Nasopharyngeal Swab     Status: None   Collection Time: 03/01/21  5:53 AM   Specimen: Nasopharyngeal Swab; Nasopharyngeal(NP) swabs in vial transport medium  Result Value Ref Range   SARS Coronavirus 2 by RT PCR NEGATIVE NEGATIVE   Influenza A by PCR NEGATIVE NEGATIVE   Influenza B by PCR NEGATIVE NEGATIVE    No results found.  Assessment/Plan: Anemia with DUB. Transfusion 2units PRBC ordered  Emeterio Reeve 03/01/2021, 10:59 AM

## 2021-03-01 NOTE — ED Notes (Signed)
PHONE HANDOFF REPORT PROVIDED TO [place]

## 2021-03-01 NOTE — Progress Notes (Signed)
   03/01/21 1708  Assess: MEWS Score  Temp 98.6 F (37 C)  BP 116/65  Pulse Rate (!) 117  SpO2 100 %  O2 Device Room Air  Assess: MEWS Score  MEWS Temp 0  MEWS Systolic 0  MEWS Pulse 2  MEWS RR 0  MEWS LOC 0  MEWS Score 2  MEWS Score Color Yellow  Assess: if the MEWS score is Yellow or Red  Were vital signs taken at a resting state? Yes  Focused Assessment No change from prior assessment  Early Detection of Sepsis Score *See Row Information* Low  MEWS guidelines implemented *See Row Information* Yes  Treat  MEWS Interventions Administered scheduled meds/treatments  Pain Scale 0-10  Pain Score 0  Take Vital Signs  Increase Vital Sign Frequency  Yellow: Q 2hr X 2 then Q 4hr X 2, if remains yellow, continue Q 4hrs  Escalate  MEWS: Escalate Yellow: discuss with charge nurse/RN and consider discussing with provider and RRT  Notify: Charge Nurse/RN  Name of Charge Nurse/RN Notified Sharee Pimple RN  Date Charge Nurse/RN Notified 03/01/21  Time Charge Nurse/RN Notified 1750  Notify: Provider  Provider Name/Title Dr. Roselie Awkward  Date Provider Notified 03/01/21  Time Provider Notified 1750  Notification Type Call  Notification Reason Other (Comment) (MEWS)  Provider response No new orders  Date of Provider Response 03/01/21  Time of Provider Response 1750  Document  Patient Outcome Stabilized after interventions  Progress note created (see row info) Yes

## 2021-03-01 NOTE — Progress Notes (Signed)
Patient arrived to unit, no distressed noted. Notified on-call provider of patients arrival to unit, awaiting in patient orders. Will continue to monitor.

## 2021-03-01 NOTE — ED Provider Notes (Signed)
[place] DEPT [facility] Provider Note: Georgena Spurling, MD, FACEP  CSN: [id number] MRN: [id number] ARRIVAL: 03/01/21 at [place]: [place]  Weakness   HISTORY OF PRESENT ILLNESS  03/01/21 1:14 AM Melissa White is a 45 y.o. female with generalized weakness.  She has had abnormal vaginal bleeding since December, attributed to fibroid.  She initially was given a Mirena IUD but this fell out.  After that she was started on Mirena.  She was seen by her PCP on 02/19/2021 and her hemoglobin was 12.3.  She subsequently started heavy bleeding again which included passage of clots.  She developed generalized weakness to the point that she had to stop working 2 days ago because she was having difficulty lifting and she was developing dyspnea on exertion.  She has been having no chest pain but does have some lower abdominal bloating.  Her OB/GYN subsequently started her on medroxyprogesterone which has slowed her bleeding.  It is now less than a regular period.  She had her CBC checked again yesterday and her hemoglobin was found to be 6.2.    Past Medical History:  Diagnosis Date  . Allergy   . Anemia   . Fibroids   . HIV (human immunodeficiency virus infection) ([place])     Past Surgical History:  Procedure Laterality Date  . CESAREAN SECTION     2  . IR RADIOLOGIST EVAL & MGMT  12/21/2018    Family History  Problem Relation Age of Onset  . Diabetes Mother   . Arthritis Mother   . Hypertension Mother   . Kidney disease Father   . Hypertension Father     Social History   Tobacco Use  . Smoking status: Never Smoker  . Smokeless tobacco: Never Used  Substance Use Topics  . Alcohol use: No  . Drug use: No    Prior to Admission medications   Medication Sig Start Date End Date Taking? Authorizing Provider  BIKTARVY 50-200-25 MG TABS tablet TK 1 T PO  QD 12/17/17   [provider]  cetirizine (ZYRTEC) 10 MG tablet Take 1 tablet (10 mg total) by mouth  daily. 08/02/16   Gloriann Loan, PA-C  ferrous sulfate 325 (65 FE) MG tablet Take 325 mg by mouth daily with breakfast.    [provider]  hydrocortisone cream 1 % Apply to affected area 2 times daily 03/15/17   Rodell Perna A, PA-C  medroxyPROGESTERone (PROVERA) 10 MG tablet Take by mouth. 02/25/21   [provider]  omeprazole (PRILOSEC) 40 MG capsule Take 1 capsule (40 mg total) by mouth daily. 10/22/20   Debbrah Alar, NP  valACYclovir (VALTREX) 500 MG tablet TAKE 1 TABLET BY MOUTH DAILY 09/13/18   [provider]    Allergies Patient has no known allergies.   REVIEW OF SYSTEMS  Negative except as noted here or in the History of Present Illness.   PHYSICAL EXAMINATION  Initial Vital Signs Blood pressure 138/66, pulse (!) 120, temperature 98.2 F (36.8 C), temperature source Oral, resp. rate 18, height 5\' 3"  (1.6 m), weight 109.9 kg, SpO2 99 %.  Examination General: Well-developed, well-nourished female in no acute distress; appearance consistent with age of record HENT: normocephalic; atraumatic Eyes: pupils equal, round and reactive to light; extraocular muscles intact Neck: supple Heart: regular rate and rhythm; tachycardia Lungs: clear to auscultation bilaterally Abdomen: soft; nondistended; nontender; bowel sounds present Extremities: No deformity; full range of motion; pulses normal Neurologic: Awake, alert and oriented; motor  function intact in all extremities and symmetric; no facial droop Skin: Warm and dry Psychiatric: Normal mood and affect   RESULTS  Summary of this visit's results, reviewed and interpreted by myself:   EKG Interpretation  Date/Time:    Ventricular Rate:    PR Interval:    QRS Duration:   QT Interval:    QTC Calculation:   R Axis:     Text Interpretation:        Laboratory Studies: Results for orders placed or performed during the hospital encounter of 03/01/21 (from the past 24 hour(s))  CBC with  Differential/Platelet     Status: Abnormal   Collection Time: 03/01/21  1:50 AM  Result Value Ref Range   WBC 7.9 4.0 - 10.5 K/uL   RBC 2.16 (L) 3.87 - 5.11 MIL/uL   Hemoglobin 6.1 (LL) 12.0 - 15.0 g/dL   HCT 18.2 (L) 36.0 - 46.0 %   MCV 84.3 80.0 - 100.0 fL   MCH 28.2 26.0 - 34.0 pg   MCHC 33.5 30.0 - 36.0 g/dL   RDW 14.7 11.5 - 15.5 %   Platelets 245 150 - 400 K/uL   nRBC 1.1 (H) 0.0 - 0.2 %   Neutrophils Relative % 66 %   Neutro Abs 5.2 1.7 - 7.7 K/uL   Lymphocytes Relative 24 %   Lymphs Abs 1.9 0.7 - 4.0 K/uL   Monocytes Relative 7 %   Monocytes Absolute 0.6 0.1 - 1.0 K/uL   Eosinophils Relative 2 %   Eosinophils Absolute 0.2 0.0 - 0.5 K/uL   Basophils Relative 0 %   Basophils Absolute 0.0 0.0 - 0.1 K/uL   Immature Granulocytes 1 %   Abs Immature Granulocytes 0.07 0.00 - 0.07 K/uL  Basic metabolic panel     Status: Abnormal   Collection Time: 03/01/21  1:50 AM  Result Value Ref Range   Sodium 138 135 - 145 mmol/L   Potassium 3.4 (L) 3.5 - 5.1 mmol/L   Chloride 108 98 - 111 mmol/L   CO2 22 22 - 32 mmol/L   Glucose, Bld 118 (H) 70 - 99 mg/dL   BUN 12 6 - 20 mg/dL   Creatinine, Ser 0.80 0.44 - 1.00 mg/dL   Calcium 8.0 (L) 8.9 - 10.3 mg/dL   GFR, Estimated >60 >60 mL/min   Anion gap 8 5 - 15   Imaging Studies: No results found.  ED COURSE and MDM  Nursing notes, initial and subsequent vitals signs, including pulse oximetry, reviewed and interpreted by myself.  Vitals:   03/01/21 0230 03/01/21 0300 03/01/21 0330 03/01/21 0412  BP: 119/73 119/73 126/76 126/76  Pulse: (!) 103 (!) 104 (!) 106 (!) 106  Resp: 18 17 (!) 21 20  Temp:      TempSrc:      SpO2: 100% 100% 100% 100%  Weight:      Height:       Medications  lactated ringers bolus 1,000 mL (1,000 mLs Intravenous New Bag/Given 03/01/21 0149)   4:32 AM Discussed with Dr. Nelda Marseille of the OB/GYN teaching service at [facility].  She accepts the patient for direct admission (observation status) for blood  transfusion.  No beds were available at [facility] or [facility]  Procedures   ED DIAGNOSES     ICD-10-CM   1. Symptomatic anemia  D64.9   2. Anemia due to acute blood loss  D62   3. Abnormal vaginal bleeding  N93.9  Bert Ptacek, Jenny Reichmann, MD 03/01/21 [phone]

## 2021-03-02 DIAGNOSIS — D649 Anemia, unspecified: Secondary | ICD-10-CM | POA: Diagnosis not present

## 2021-03-02 DIAGNOSIS — N939 Abnormal uterine and vaginal bleeding, unspecified: Secondary | ICD-10-CM | POA: Diagnosis not present

## 2021-03-02 DIAGNOSIS — D259 Leiomyoma of uterus, unspecified: Secondary | ICD-10-CM

## 2021-03-02 LAB — TYPE AND SCREEN
ABO/RH(D): O POS
Antibody Screen: NEGATIVE
Unit division: 0
Unit division: 0

## 2021-03-02 LAB — BPAM RBC
Blood Product Expiration Date: 202206122359
Blood Product Expiration Date: 202206122359
ISSUE DATE / TIME: 202205141655
ISSUE DATE / TIME: 202205142156
Unit Type and Rh: 5100
Unit Type and Rh: 5100

## 2021-03-02 LAB — HEMOGLOBIN AND HEMATOCRIT, BLOOD
HCT: 23.4 % — ABNORMAL LOW (ref 36.0–46.0)
Hemoglobin: 7.9 g/dL — ABNORMAL LOW (ref 12.0–15.0)

## 2021-03-02 MED ORDER — IBUPROFEN 600 MG PO TABS: 600.0000 mg | ORAL_TABLET | Freq: Four times a day (QID) | ORAL | 0 refills | Status: DC | PRN

## 2021-03-02 NOTE — Progress Notes (Signed)
Nsg Discharge Note  Admit Date:  03/01/2021 Discharge date: 03/02/2021   Gerilyn Nestle to be D/C'd  per MD order.  AVS completed. Patient/caregiver able to verbalize understanding.  Discharge Medication: Allergies as of 03/02/2021   No Known Allergies     Medication List    STOP taking these medications   hydrocortisone cream 1 %   megestrol 40 MG tablet Commonly known as: MEGACE     TAKE these medications   Biktarvy 50-200-25 MG Tabs tablet Generic drug: bictegravir-emtricitabine-tenofovir AF Take 1 tablet by mouth daily.   cetirizine 10 MG tablet Commonly known as: ZYRTEC Take 1 tablet (10 mg total) by mouth daily.   ferrous sulfate 325 (65 FE) MG tablet Take 325 mg by mouth daily with breakfast.   ibuprofen 600 MG tablet Commonly known as: ADVIL Take 1 tablet (600 mg total) by mouth every 6 (six) hours as needed (for pain).   medroxyPROGESTERone 10 MG tablet Commonly known as: PROVERA Take 10 mg by mouth daily.   omeprazole 40 MG capsule Commonly known as: PRILOSEC Take 1 capsule (40 mg total) by mouth daily.   valACYclovir 500 MG tablet Commonly known as: VALTREX Take 500 mg by mouth daily.       Discharge Assessment: Vitals:   03/02/21 0117 03/02/21 0430  BP: (!) 107/59 106/66  Pulse: 97 98  Resp: 17 17  Temp: 98.2 F (36.8 C) 98 F (36.7 C)  SpO2: 100% 100%   Skin clean, dry and intact without evidence of skin break down, no evidence of skin tears noted. IV catheter discontinued intact. Site without signs and symptoms of complications - no redness or edema noted at insertion site, patient denies c/o pain - only slight tenderness at site.  Dressing with slight pressure applied.  D/c Instructions-Education: Discharge instructions given to patient/family with verbalized understanding. D/c education completed with patient/family including follow up instructions, medication list, d/c activities limitations if indicated, with other d/c instructions  as indicated by MD - patient able to verbalize understanding, all questions fully answered. Patient instructed to return to ED, call 911, or call MD for any changes in condition.  Patient escorted via [place], and D/C home via private auto.  Panagiotis Oelkers, Jolene Schimke, RN 03/02/2021 12:36 PM

## 2021-03-02 NOTE — Plan of Care (Signed)
  Problem: Health Behavior/Discharge Planning: Goal: Ability to manage health-related needs will improve Outcome: Completed/Met  Discharged to go home

## 2021-03-02 NOTE — Discharge Summary (Signed)
Physician Discharge Summary  Patient ID: Melissa White MRN: [id number] DOB/AGE: Oct 08, 1976 45 y.o.  Admit date: 03/01/2021 Discharge date: 03/02/2021  Admission Diagnoses:DUB, symptomatic anemia, fibroid uterus  Discharge Diagnoses:  Active Problems:   Symptomatic anemia   Discharged Condition: fair  Hospital Course: Melissa White is an 45 y.o. female. No obstetric history on file. No LMP recorded. (Menstrual status: Irregular Periods). She presented to the ED with weakness, with prolonged DUB since December, followed by Dr. Dema Severin at [facility]. She was using Megace but stopped recently and her bleeding in worse. Sent to receive transfusion for sx anemia and she consents. She is scheduled to have [place] at [facility] on 03/11/21  Consults: None  Significant Diagnostic Studies: labs:  CBC    Component Value Date/Time   WBC 7.9 03/01/2021 0150   RBC 2.16 (L) 03/01/2021 0150   HGB 7.9 (L) 03/02/2021 0223   HCT 23.4 (L) 03/02/2021 0223   PLT 245 03/01/2021 0150   MCV 84.3 03/01/2021 0150   MCH 28.2 03/01/2021 0150   MCHC 33.5 03/01/2021 0150   RDW 14.7 03/01/2021 0150   LYMPHSABS 1.9 03/01/2021 0150   MONOABS 0.6 03/01/2021 0150   EOSABS 0.2 03/01/2021 0150   BASOSABS 0.0 03/01/2021 0150     Treatments: transfusion 2 units PRBC  Discharge Exam: Blood pressure 106/66, pulse 98, temperature 98 F (36.7 C), temperature source Oral, resp. rate 17, height 5\' 3"  (1.6 m), weight 109.9 kg, SpO2 100 %. General appearance: alert, cooperative and no distress Resp: normal effort GI: soft, non-tender; bowel sounds normal; no masses,  no organomegaly  Disposition: Discharge disposition: 01-Home or Self Care       Discharge Instructions    Discharge patient   Complete by: As directed    Discharge disposition: 01-Home or Self Care   Discharge patient date: 03/02/2021     Allergies as of 03/02/2021   No Known Allergies     Medication List    STOP taking these medications    hydrocortisone cream 1 %   megestrol 40 MG tablet Commonly known as: MEGACE     TAKE these medications   Biktarvy 50-200-25 MG Tabs tablet Generic drug: bictegravir-emtricitabine-tenofovir AF Take 1 tablet by mouth daily.   cetirizine 10 MG tablet Commonly known as: ZYRTEC Take 1 tablet (10 mg total) by mouth daily.   ferrous sulfate 325 (65 FE) MG tablet Take 325 mg by mouth daily with breakfast.   ibuprofen 600 MG tablet Commonly known as: ADVIL Take 1 tablet (600 mg total) by mouth every 6 (six) hours as needed (for pain).   medroxyPROGESTERone 10 MG tablet Commonly known as: PROVERA Take 10 mg by mouth daily.   omeprazole 40 MG capsule Commonly known as: PRILOSEC Take 1 capsule (40 mg total) by mouth daily.   valACYclovir 500 MG tablet Commonly known as: VALTREX Take 500 mg by mouth daily.       Follow-up Information    [facility] Follow up in 2 week(s).   Specialty: Obstetrics and Gynecology Why: Dr. Genice Rouge information: [place] [phone] [phone]               Signed: Emeterio Reeve 03/02/2021, 7:16 AM

## 2021-03-02 NOTE — Discharge Instructions (Signed)
Abnormal Uterine Bleeding Abnormal uterine bleeding means bleeding more than usual from your womb (uterus). It can include:  Bleeding between menstrual periods.  Bleeding after sex.  Bleeding that is heavier than normal.  Menstrual periods that last longer than usual.  Bleeding after you have stopped having your menstrual period (menopause). There are many problems that may cause this. You should see a doctor for any kind of bleeding that is not normal. Treatment depends on the cause of the bleeding. Follow these instructions at home: Medicines  Take over-the-counter and prescription medicines only as told by your doctor.  Tell your doctor about other medicines that you take. ? If told by your doctor, stop taking aspirin or medicines that have aspirin in them. These medicines can make you bleed more.  You may be given iron pills to replace iron that your body loses because of this condition. Take them as told by your doctor. Managing constipation If you are taking iron pills, you may have trouble pooping (constipation). To prevent or treat trouble pooping, you may need to:  Drink enough fluid to keep your pee (urine) pale yellow.  Take over-the-counter or prescription medicines.  Eat foods that are high in fiber. These include beans, whole grains, and fresh fruits and vegetables.  Limit foods that are high in fat and sugar. These include fried or sweet foods. General instructions  Watch your condition for any changes.  Do not use tampons, douche, or have sex, if your doctor tells you not to.  Change your pads often.  Get regular exams. This includes pelvic exams and cervical cancer screenings. ? It is up to you to get the results of any tests that are done. Ask your doctor, or the department that is doing the tests, when your results will be ready.  Keep all follow-up visits as told by your doctor. This is important. Contact a doctor if:  The bleeding lasts more than 1  week.  You feel dizzy at times.  You feel like you may vomit (nausea).  You vomit.  You feel light-headed or weak.  Your symptoms get worse. Get help right away if:  You pass out.  You have to change pads every hour.  You have pain in your belly.  You have a fever or chills.  You get sweaty.  You get weak.  You pass large blood clots from your vagina. Summary  Abnormal uterine bleeding means bleeding more than usual from your womb (uterus).  Any kind of bleeding that is not normal should be checked by a doctor.  Treatment depends on the cause of the bleeding.  Get help right away if you pass out, you have to change pads every hour, or you pass large blood clots from your vagina. This information is not intended to replace advice given to you by your health care provider. Make sure you discuss any questions you have with your health care provider. Document Revised: 08/08/2019 Document Reviewed: 08/08/2019 Elsevier Patient Education  2021 Elsevier Inc.  

## 2021-03-11 DIAGNOSIS — B20 Human immunodeficiency virus [HIV] disease: Secondary | ICD-10-CM | POA: Diagnosis not present

## 2021-03-11 DIAGNOSIS — D259 Leiomyoma of uterus, unspecified: Secondary | ICD-10-CM | POA: Diagnosis not present

## 2021-03-11 DIAGNOSIS — Z79899 Other long term (current) drug therapy: Secondary | ICD-10-CM | POA: Diagnosis not present

## 2021-03-11 DIAGNOSIS — Z21 Asymptomatic human immunodeficiency virus [HIV] infection status: Secondary | ICD-10-CM | POA: Diagnosis not present

## 2021-03-11 DIAGNOSIS — D509 Iron deficiency anemia, unspecified: Secondary | ICD-10-CM | POA: Diagnosis not present

## 2021-03-11 DIAGNOSIS — J302 Other seasonal allergic rhinitis: Secondary | ICD-10-CM | POA: Diagnosis not present

## 2021-03-11 HISTORY — PX: UTERINE FIBROID EMBOLIZATION: SHX825

## 2021-03-12 DIAGNOSIS — D509 Iron deficiency anemia, unspecified: Secondary | ICD-10-CM | POA: Diagnosis not present

## 2021-03-12 DIAGNOSIS — Z79899 Other long term (current) drug therapy: Secondary | ICD-10-CM | POA: Diagnosis not present

## 2021-03-12 DIAGNOSIS — J302 Other seasonal allergic rhinitis: Secondary | ICD-10-CM | POA: Diagnosis not present

## 2021-03-12 DIAGNOSIS — Z21 Asymptomatic human immunodeficiency virus [HIV] infection status: Secondary | ICD-10-CM | POA: Diagnosis not present

## 2021-03-12 DIAGNOSIS — D259 Leiomyoma of uterus, unspecified: Secondary | ICD-10-CM | POA: Diagnosis not present

## 2021-03-13 ENCOUNTER — Emergency Department (HOSPITAL_BASED_OUTPATIENT_CLINIC_OR_DEPARTMENT_OTHER)
Admission: EM | Admit: 2021-03-13 | Discharge: 2021-03-13 | Disposition: A | Discharge status: Home / Self Care | Payer: BC Managed Care – PPO | Attending: Emergency Medicine | Admitting: Emergency Medicine

## 2021-03-13 ENCOUNTER — Other Ambulatory Visit: Payer: Self-pay

## 2021-03-13 ENCOUNTER — Emergency Department (HOSPITAL_BASED_OUTPATIENT_CLINIC_OR_DEPARTMENT_OTHER): Payer: BC Managed Care – PPO

## 2021-03-13 ENCOUNTER — Encounter (HOSPITAL_BASED_OUTPATIENT_CLINIC_OR_DEPARTMENT_OTHER): Payer: Self-pay | Admitting: Emergency Medicine

## 2021-03-13 DIAGNOSIS — R1084 Generalized abdominal pain: Secondary | ICD-10-CM | POA: Diagnosis not present

## 2021-03-13 DIAGNOSIS — M2578 Osteophyte, vertebrae: Secondary | ICD-10-CM | POA: Diagnosis not present

## 2021-03-13 DIAGNOSIS — N200 Calculus of kidney: Secondary | ICD-10-CM | POA: Diagnosis not present

## 2021-03-13 DIAGNOSIS — R140 Abdominal distension (gaseous): Secondary | ICD-10-CM | POA: Insufficient documentation

## 2021-03-13 DIAGNOSIS — Z21 Asymptomatic human immunodeficiency virus [HIV] infection status: Secondary | ICD-10-CM | POA: Insufficient documentation

## 2021-03-13 DIAGNOSIS — M5137 Other intervertebral disc degeneration, lumbosacral region: Secondary | ICD-10-CM | POA: Diagnosis not present

## 2021-03-13 DIAGNOSIS — I878 Other specified disorders of veins: Secondary | ICD-10-CM | POA: Diagnosis not present

## 2021-03-13 LAB — COMPREHENSIVE METABOLIC PANEL
ALT: 13 U/L (ref 0–44)
AST: 24 U/L (ref 15–41)
Albumin: 3.4 g/dL — ABNORMAL LOW (ref 3.5–5.0)
Alkaline Phosphatase: 71 U/L (ref 38–126)
Anion gap: 10 (ref 5–15)
BUN: 7 mg/dL (ref 6–20)
CO2: 26 mmol/L (ref 22–32)
Calcium: 8.8 mg/dL — ABNORMAL LOW (ref 8.9–10.3)
Chloride: 100 mmol/L (ref 98–111)
Creatinine, Ser: 0.64 mg/dL (ref 0.44–1.00)
GFR, Estimated: >60 mL/min (ref >60)
Glucose, Bld: 119 mg/dL — ABNORMAL HIGH (ref 70–99)
Potassium: 3.7 mmol/L (ref 3.5–5.1)
Sodium: 136 mmol/L (ref 135–145)
Total Bilirubin: 0.5 mg/dL (ref 0.3–1.2)
Total Protein: 8.2 g/dL — ABNORMAL HIGH (ref 6.5–8.1)

## 2021-03-13 LAB — CBC WITH DIFFERENTIAL/PLATELET
Abs Immature Granulocytes: 0.03 10*3/uL (ref 0.00–0.07)
Basophils Absolute: 0 10*3/uL (ref 0.0–0.1)
Basophils Relative: 0 %
Eosinophils Absolute: 0.1 10*3/uL (ref 0.0–0.5)
Eosinophils Relative: 1 %
HCT: 29.9 % — ABNORMAL LOW (ref 36.0–46.0)
Hemoglobin: 10.1 g/dL — ABNORMAL LOW (ref 12.0–15.0)
Immature Granulocytes: 0 %
Lymphocytes Relative: 9 %
Lymphs Abs: 1.1 10*3/uL (ref 0.7–4.0)
MCH: 27.6 pg (ref 26.0–34.0)
MCHC: 33.8 g/dL (ref 30.0–36.0)
MCV: 81.7 fL (ref 80.0–100.0)
Monocytes Absolute: 0.9 10*3/uL (ref 0.1–1.0)
Monocytes Relative: 7 %
Neutro Abs: 10.3 10*3/uL — ABNORMAL HIGH (ref 1.7–7.7)
Neutrophils Relative %: 83 %
Platelets: 461 10*3/uL — ABNORMAL HIGH (ref 150–400)
RBC: 3.66 MIL/uL — ABNORMAL LOW (ref 3.87–5.11)
RDW: 15.2 % (ref 11.5–15.5)
WBC: 12.4 10*3/uL — ABNORMAL HIGH (ref 4.0–10.5)
nRBC: 0 % (ref 0.0–0.2)

## 2021-03-13 LAB — LIPASE, BLOOD: Lipase: 27 U/L (ref 11–51)

## 2021-03-13 MED ORDER — ONDANSETRON 4 MG PO TBDP: 4.0000 mg | ORAL_TABLET | Freq: Three times a day (TID) | ORAL | 0 refills | Status: DC | PRN

## 2021-03-13 MED ORDER — SENNOSIDES-DOCUSATE SODIUM 8.6-50 MG PO TABS: 1.0000 | ORAL_TABLET | Freq: Every evening | ORAL | 0 refills | Status: DC | PRN

## 2021-03-13 MED ORDER — ONDANSETRON HCL 4 MG/2ML IJ SOLN
4.0000 mg | Freq: Once | INTRAMUSCULAR | Status: AC
  Administered 2021-03-13: 4 mg via INTRAVENOUS
  Filled 2021-03-13: qty 2

## 2021-03-13 MED ORDER — MORPHINE SULFATE (PF) 4 MG/ML IV SOLN
4.0000 mg | Freq: Once | INTRAVENOUS | Status: AC
Start: 2021-03-13 — End: 2021-03-13
  Administered 2021-03-13: 4 mg via INTRAVENOUS
  Filled 2021-03-13: qty 1

## 2021-03-13 MED ORDER — DICYCLOMINE HCL 20 MG PO TABS: 20.0000 mg | ORAL_TABLET | Freq: Three times a day (TID) | ORAL | 0 refills | Status: DC | PRN

## 2021-03-13 NOTE — ED Triage Notes (Addendum)
Pt c/o mid-abd pain. Pt had uterine ablation surgery 5/24 and reports no BM since surgery.

## 2021-03-13 NOTE — ED Provider Notes (Signed)
Blood pressure 133/76, pulse (!) 103, temperature 97.8 F (36.6 C), temperature source Oral, resp. rate 18, height 5\' 3"  (1.6 m), weight 109 kg, SpO2 98 %.  Assuming care from Dr. Betsey Holiday.  In short, Melissa White is a 45 y.o. female with a chief complaint of Abdominal Pain .  Refer to the original H&P for additional details.  The current plan of care is to follow up on CT.  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  Small amount of scattered gas in the endometrial cavity noted.  Labs reviewed.  No anemia.  Plan for symptom treatment at home and patient will call the GYN team today for follow-up appointment. Discussed ED return precautions.     Margette Fast, MD 03/13/21 [phone]

## 2021-03-13 NOTE — Discharge Instructions (Signed)
You were seen in the emergency room today with abdominal pain.  The CT scan shows normal postoperative changes.  I have called in several medicines to help with your symptoms.  Please call your GYN team today to schedule a follow-up appointment ASAP.

## 2021-03-13 NOTE — ED Provider Notes (Signed)
[facility] EMERGENCY DEPARTMENT Provider Note   CSN: [id number] Arrival date & time: 03/13/21  0556     History Chief Complaint  Patient presents with  . Abdominal Pain    Melissa White is a 45 y.o. female.  Patient presents to the emergency department for evaluation of abdominal distention and discomfort.  Patient underwent uterine ablation for heavy vaginal bleeding 2 days ago at [facility].  Since then she has been feeling very bloated.  Patient reports that she has not had a bowel movement since the procedure.  No nausea or vomiting.  No fever.        Past Medical History:  Diagnosis Date  . Allergy   . Anemia   . Fibroids   . HIV (human immunodeficiency virus infection) [facility])     Patient Active Problem List   Diagnosis Date Noted  . Symptomatic anemia 03/01/2021  . Low back pain 08/21/2019  . Costochondritis 08/08/2019  . HIV positive ([place]) 06/16/2017  . Menstrual cramps 06/16/2017    Past Surgical History:  Procedure Laterality Date  . CESAREAN SECTION     2  . IR RADIOLOGIST EVAL & MGMT  12/21/2018     OB History   No obstetric history on file.     Family History  Problem Relation Age of Onset  . Diabetes Mother   . Arthritis Mother   . Hypertension Mother   . Kidney disease Father   . Hypertension Father     Social History   Tobacco Use  . Smoking status: Never Smoker  . Smokeless tobacco: Never Used  Substance Use Topics  . Alcohol use: No  . Drug use: No    Home Medications Prior to Admission medications   Medication Sig Start Date End Date Taking? Authorizing Provider  BIKTARVY 50-200-25 MG TABS tablet Take 1 tablet by mouth daily. 12/17/17  Yes [provider]  cetirizine (ZYRTEC) 10 MG tablet Take 1 tablet (10 mg total) by mouth daily. 08/02/16  Yes Gloriann Loan, PA-C  ferrous sulfate 325 (65 FE) MG tablet Take 325 mg by mouth daily with breakfast.   Yes [provider]  ibuprofen (ADVIL) 600  MG tablet Take 1 tablet (600 mg total) by mouth every 6 (six) hours as needed (for pain). 03/02/21  Yes Woodroe Mode, MD  medroxyPROGESTERone (PROVERA) 10 MG tablet Take 10 mg by mouth daily. 02/25/21  Yes [provider]  omeprazole (PRILOSEC) 40 MG capsule Take 1 capsule (40 mg total) by mouth daily. 10/22/20  Yes Debbrah Alar, NP  valACYclovir (VALTREX) 500 MG tablet Take 500 mg by mouth daily. 09/13/18  Yes [provider]    Allergies    Patient has no known allergies.  Review of Systems   Review of Systems  Gastrointestinal: Positive for abdominal distention and constipation.  All other systems reviewed and are negative.   Physical Exam Updated Vital Signs BP 133/76   Pulse (!) 103   Temp 97.8 F (36.6 C) (Oral)   Resp 18   Ht 5\' 3"  (1.6 m)   Wt 109 kg   SpO2 98%   BMI 42.57 kg/m   Physical Exam Vitals and nursing note reviewed.  Constitutional:      General: She is not in acute distress.    Appearance: Normal appearance. She is well-developed.  HENT:     Head: Normocephalic and atraumatic.     Right Ear: Hearing normal.     Left Ear: Hearing normal.  Nose: Nose normal.  Eyes:     Conjunctiva/sclera: Conjunctivae normal.     Pupils: Pupils are equal, round, and reactive to light.  Cardiovascular:     Rate and Rhythm: Regular rhythm.     Heart sounds: S1 normal and S2 normal. No murmur heard. No friction rub. No gallop.   Pulmonary:     Effort: Pulmonary effort is normal. No respiratory distress.     Breath sounds: Normal breath sounds.  Chest:     Chest wall: No tenderness.  Abdominal:     General: Bowel sounds are normal.     Palpations: Abdomen is soft.     Tenderness: There is generalized abdominal tenderness. There is no guarding or rebound. Negative signs include Murphy's sign and McBurney's sign.     Hernia: No hernia is present.  Musculoskeletal:        General: Normal range of motion.     Cervical back: Normal range of  motion and neck supple.  Skin:    General: Skin is warm and dry.     Findings: No rash.  Neurological:     Mental Status: She is alert and oriented to person, place, and time.     GCS: GCS eye subscore is 4. GCS verbal subscore is 5. GCS motor subscore is 6.     Cranial Nerves: No cranial nerve deficit.     Sensory: No sensory deficit.     Coordination: Coordination normal.  Psychiatric:        Speech: Speech normal.        Behavior: Behavior normal.        Thought Content: Thought content normal.     ED Results / Procedures / Treatments   Labs (all labs ordered are listed, but only abnormal results are displayed) Labs Reviewed  CBC WITH DIFFERENTIAL/PLATELET - Abnormal; Notable for the following components:      Result Value   WBC 12.4 (*)    RBC 3.66 (*)    Hemoglobin 10.1 (*)    HCT 29.9 (*)    Platelets 461 (*)    Neutro Abs 10.3 (*)    All other components within normal limits  COMPREHENSIVE METABOLIC PANEL - Abnormal; Notable for the following components:   Glucose, Bld 119 (*)    Calcium 8.8 (*)    Total Protein 8.2 (*)    Albumin 3.4 (*)    All other components within normal limits  LIPASE, BLOOD    EKG None  Radiology No results found.  Procedures Procedures   Medications Ordered in ED Medications  morphine 4 MG/ML injection 4 mg (4 mg Intravenous Given 03/13/21 0737)  ondansetron (ZOFRAN) injection 4 mg (4 mg Intravenous Given 03/13/21 0735)    ED Course  I have reviewed the triage vital signs and the nursing notes.  Pertinent labs & imaging results that were available during my care of the patient were reviewed by me and considered in my medical decision making (see chart for details).    MDM Rules/Calculators/A&P                          Patient with mild generalized abdominal tenderness with slight distention and decreased but present bowel sounds.  Patient had uterine ablation 2 days ago.  No focality or concern for acute surgical process at  this time.  Will order labs and CT abdomen and pelvis to further evaluate.  At time of shift change, work-up initiated but  no results back.  Will sign out to oncoming ER physician to follow-up patient. Final Clinical Impression(s) / ED Diagnoses Final diagnoses:  Generalized abdominal pain    Rx / DC Orders ED Discharge Orders    None       Ngoc Detjen, Gwenyth Allegra, MD 03/13/21 [phone]

## 2021-03-19 DIAGNOSIS — D259 Leiomyoma of uterus, unspecified: Secondary | ICD-10-CM | POA: Diagnosis not present

## 2021-03-19 DIAGNOSIS — D251 Intramural leiomyoma of uterus: Secondary | ICD-10-CM | POA: Diagnosis not present

## 2021-03-19 DIAGNOSIS — D509 Iron deficiency anemia, unspecified: Secondary | ICD-10-CM | POA: Diagnosis not present

## 2021-03-26 DIAGNOSIS — G4733 Obstructive sleep apnea (adult) (pediatric): Secondary | ICD-10-CM | POA: Diagnosis not present

## 2021-03-26 DIAGNOSIS — D219 Benign neoplasm of connective and other soft tissue, unspecified: Secondary | ICD-10-CM | POA: Diagnosis not present

## 2021-03-31 DIAGNOSIS — D251 Intramural leiomyoma of uterus: Secondary | ICD-10-CM | POA: Diagnosis not present

## 2021-03-31 DIAGNOSIS — Z9889 Other specified postprocedural states: Secondary | ICD-10-CM | POA: Diagnosis not present

## 2021-03-31 DIAGNOSIS — Z30013 Encounter for initial prescription of injectable contraceptive: Secondary | ICD-10-CM | POA: Diagnosis not present

## 2021-04-01 ENCOUNTER — Telehealth: Payer: Self-pay | Admitting: Neurology

## 2021-04-01 IMAGING — DX DG CERVICAL SPINE COMPLETE 4+V
7 series · 7 of 7 positions shown · non-contrast
Comparison: None.

CLINICAL DATA: Right lateral neck and shoulder pain

EXAM:
CERVICAL SPINE - COMPLETE 4+ VIEW

[c-spine lat]
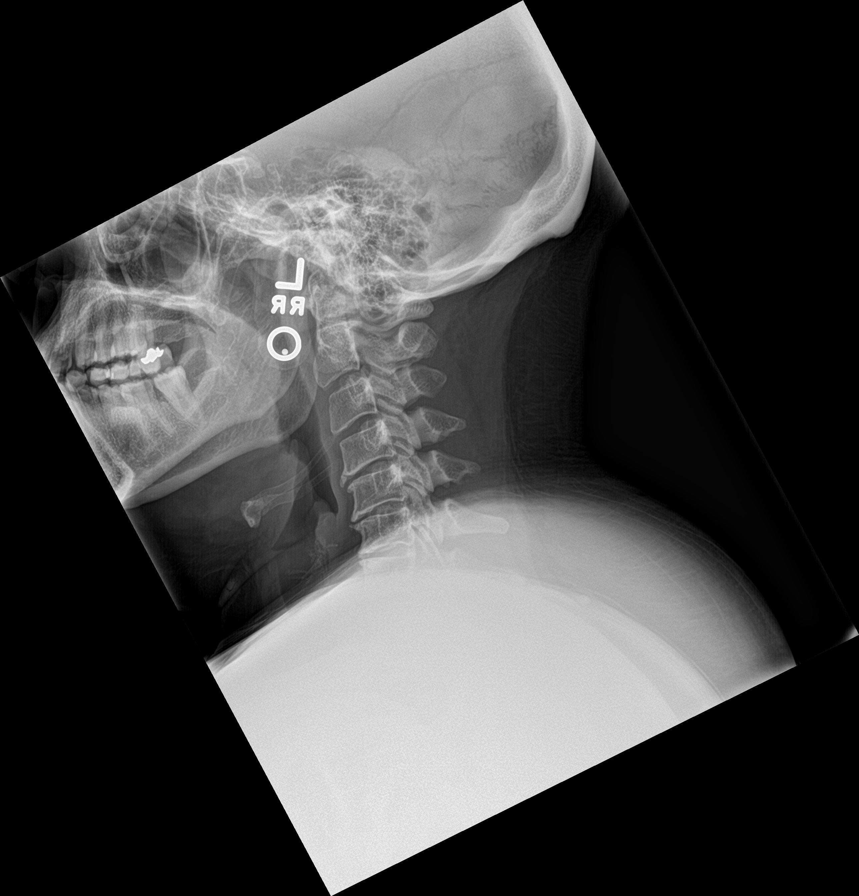

[c-spine obl (1 of 2)]
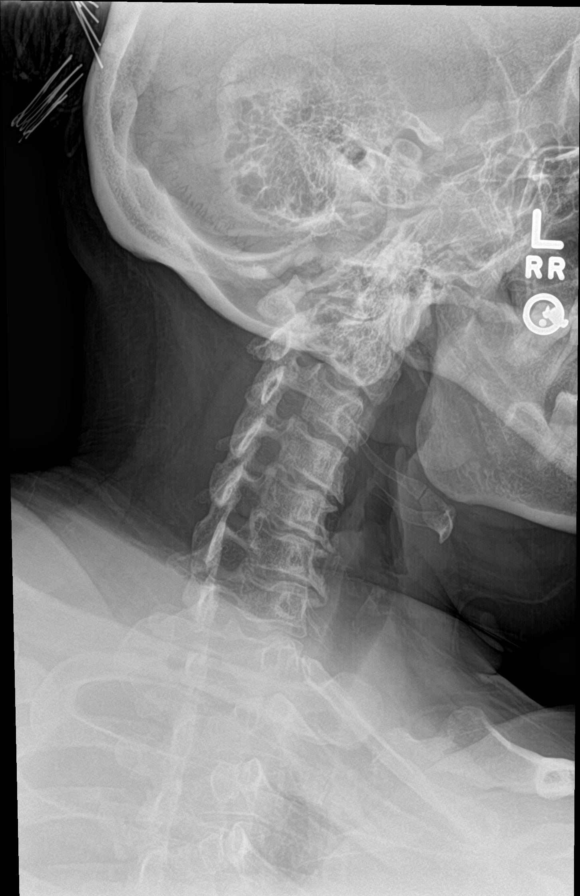

[c-spine obl (2 of 2)]
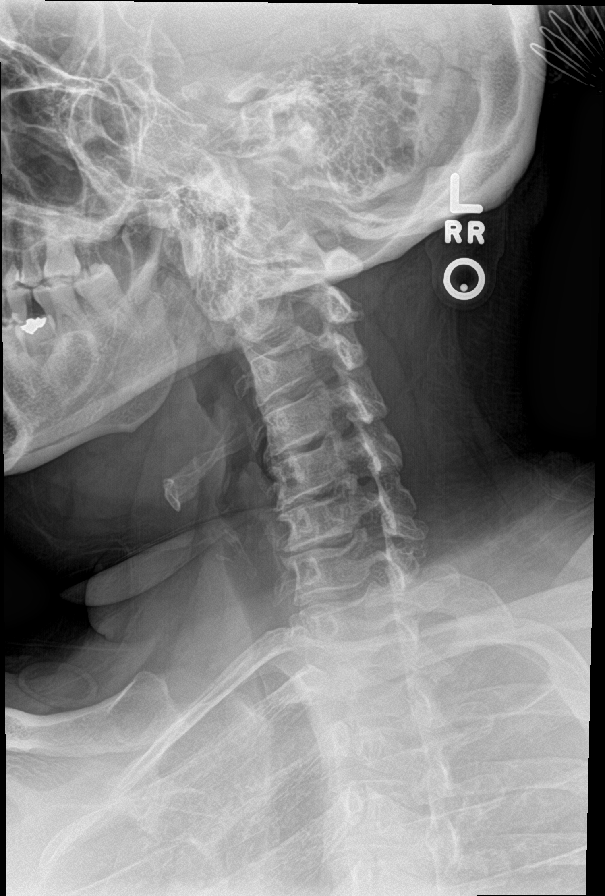

[c-spine ap]
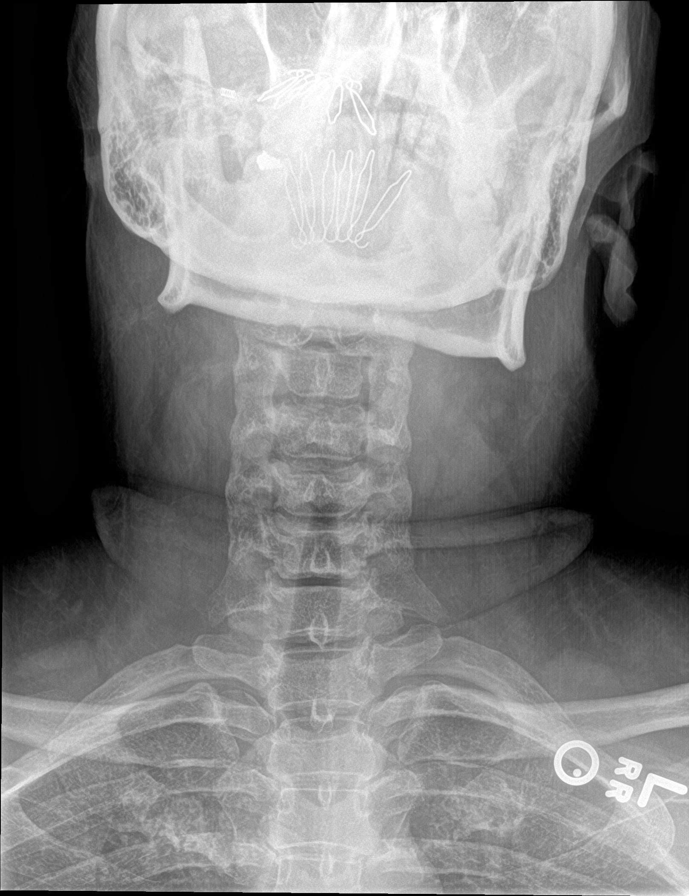

[c-spine open mouth]
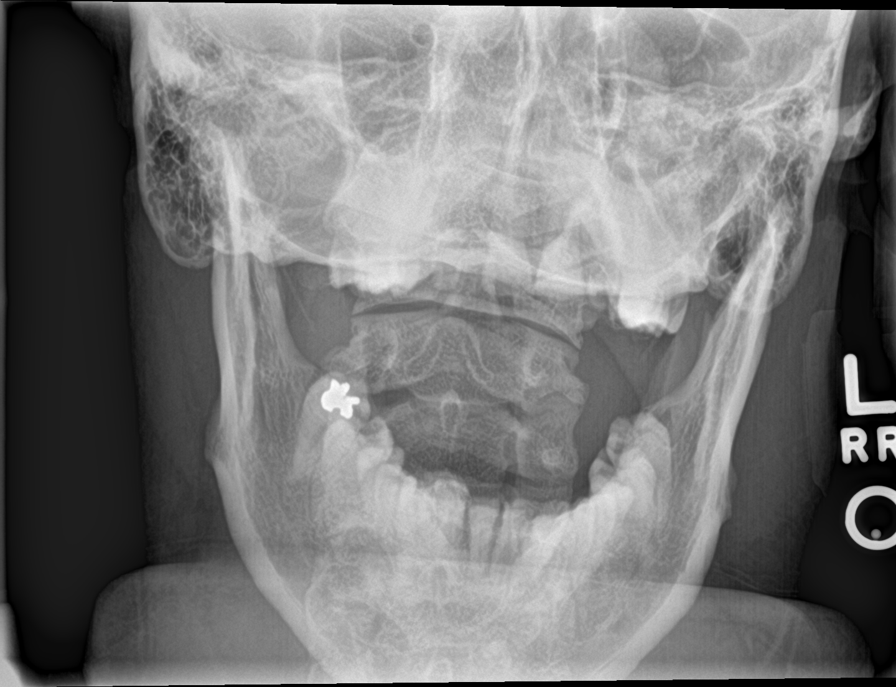

[c-spine swimmers]
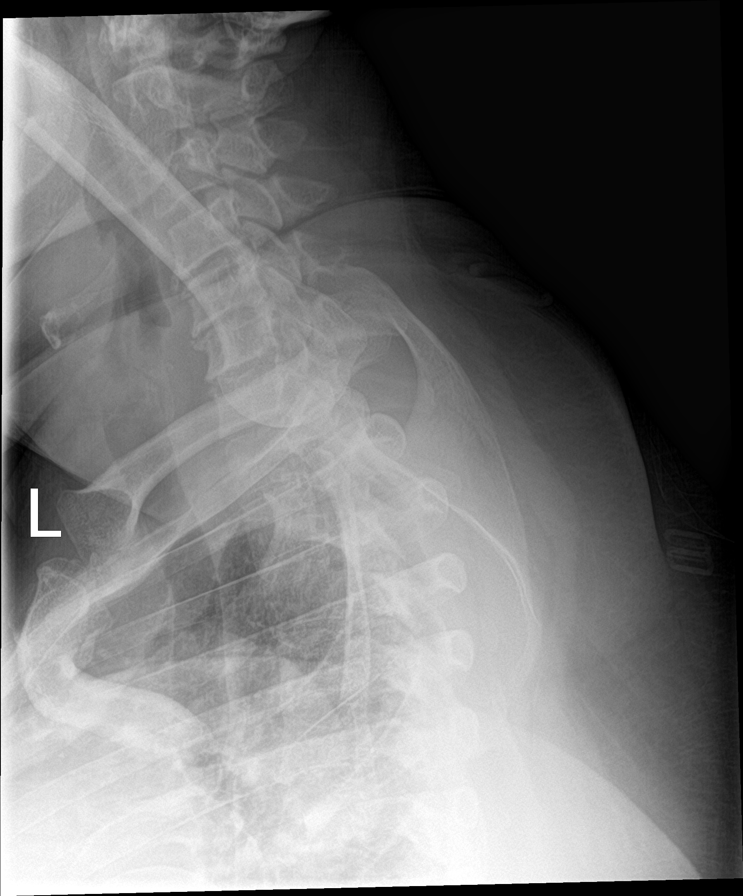

[[person_name]]
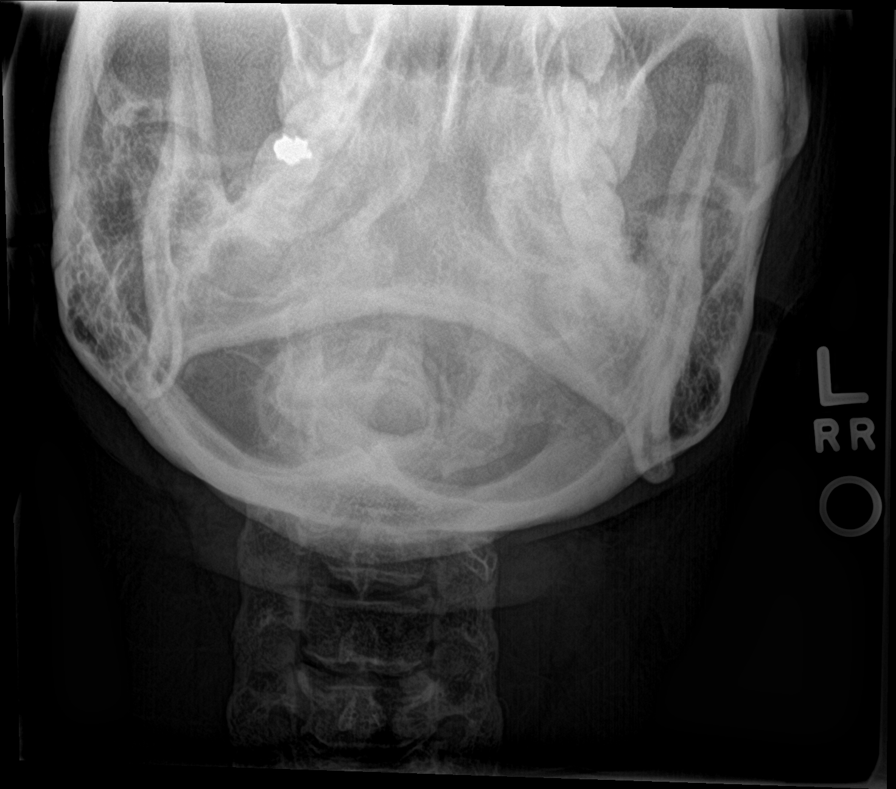

[7 of 7 positions shown; findings below may reference images not displayed]

FINDINGS: Loss of cervical lordosis. No subluxation. No fracture. Prevertebral
soft tissues are normal. Mild degenerative disc disease changes at
C4-5 through C6-7 with disc space narrowing and anterior spurring.
IMPRESSION: Degenerative changes as above.  No acute bony abnormality.

Loss of cervical lordosis.

## 2021-04-01 NOTE — Telephone Encounter (Signed)
I have also sent a my chart message about this as well.

## 2021-04-01 NOTE — Telephone Encounter (Signed)
I have reached out to [employer]. [employer] sts pt has started on autopap on 03/26/2021.  I checked icode connect but no data has been uploaded, I have asked [employer] to discuss how to scan the QR code so we can have access to her data and then the Doctor can review.

## 2021-04-01 NOTE — Telephone Encounter (Signed)
Pt called, CPAP machine blowing too fast, can not breath. Would like a call from the nurse.

## 2021-04-07 ENCOUNTER — Telehealth: Payer: Self-pay

## 2021-04-07 DIAGNOSIS — G4733 Obstructive sleep apnea (adult) (pediatric): Secondary | ICD-10-CM

## 2021-04-07 NOTE — Telephone Encounter (Signed)
Pt sent my chartmessage requesting MD to review data from [place] machine to see if air pressure could be adjusted.  Pt sts the pressure is too high and she cannot wear the mask long.   I have printed what data has been stored on the machine for MD to review and advise.

## 2021-04-07 NOTE — Telephone Encounter (Signed)
See my chart message from 04/07/21.

## 2021-04-07 NOTE — Telephone Encounter (Signed)
I reviewed patient's current AutoPap download, she used her machine 4 days since 03/26/2021.  95th percentile of pressure at 8.2 cm, mean pressure of 7.4 cm.  Average usage for days on treatment of 1 hour and 46 minutes, residual AHI 2.4/h, no significant central apneas.  Please advise patient that we will reduce her current pressure range from 7-13 cm to 4-10 cm for better tolerance.  Please send order to her DME company.  Please encourage patient to get back on her AutoPap machine consistently.

## 2021-04-07 NOTE — Addendum Note (Signed)
Addended by: Star Age on: 04/07/2021 02:09 PM   Modules accepted: Orders

## 2021-04-25 DIAGNOSIS — G4733 Obstructive sleep apnea (adult) (pediatric): Secondary | ICD-10-CM | POA: Diagnosis not present

## 2021-05-26 DIAGNOSIS — G4733 Obstructive sleep apnea (adult) (pediatric): Secondary | ICD-10-CM | POA: Diagnosis not present

## 2021-06-11 DIAGNOSIS — D219 Benign neoplasm of connective and other soft tissue, unspecified: Secondary | ICD-10-CM | POA: Diagnosis not present

## 2021-06-17 ENCOUNTER — Ambulatory Visit: Payer: BC Managed Care – PPO | Admitting: Neurology

## 2021-06-17 DIAGNOSIS — Z3042 Encounter for surveillance of injectable contraceptive: Secondary | ICD-10-CM | POA: Diagnosis not present

## 2021-06-26 DIAGNOSIS — G4733 Obstructive sleep apnea (adult) (pediatric): Secondary | ICD-10-CM | POA: Diagnosis not present

## 2021-06-30 ENCOUNTER — Ambulatory Visit: Payer: Self-pay | Admitting: Neurology

## 2021-06-30 DIAGNOSIS — Z23 Encounter for immunization: Secondary | ICD-10-CM | POA: Diagnosis not present

## 2021-06-30 DIAGNOSIS — B20 Human immunodeficiency virus [HIV] disease: Secondary | ICD-10-CM | POA: Diagnosis not present

## 2021-06-30 DIAGNOSIS — G4730 Sleep apnea, unspecified: Secondary | ICD-10-CM | POA: Diagnosis not present

## 2021-06-30 DIAGNOSIS — Z8742 Personal history of other diseases of the female genital tract: Secondary | ICD-10-CM | POA: Diagnosis not present

## 2021-06-30 DIAGNOSIS — Z79899 Other long term (current) drug therapy: Secondary | ICD-10-CM | POA: Diagnosis not present

## 2021-06-30 DIAGNOSIS — G5600 Carpal tunnel syndrome, unspecified upper limb: Secondary | ICD-10-CM | POA: Diagnosis not present

## 2021-07-03 DIAGNOSIS — Z9889 Other specified postprocedural states: Secondary | ICD-10-CM | POA: Diagnosis not present

## 2021-07-03 DIAGNOSIS — D251 Intramural leiomyoma of uterus: Secondary | ICD-10-CM | POA: Diagnosis not present

## 2021-08-04 ENCOUNTER — Encounter: Payer: Self-pay | Admitting: Adult Health

## 2021-08-04 ENCOUNTER — Ambulatory Visit (INDEPENDENT_AMBULATORY_CARE_PROVIDER_SITE_OTHER): Payer: BC Managed Care – PPO | Admitting: Adult Health

## 2021-08-04 VITALS — BP 139/85 | HR 86 | Ht 63.0 in | Wt 245.0 lb

## 2021-08-04 DIAGNOSIS — G4733 Obstructive sleep apnea (adult) (pediatric): Secondary | ICD-10-CM | POA: Diagnosis not present

## 2021-08-04 DIAGNOSIS — Z9989 Dependence on other enabling machines and devices: Secondary | ICD-10-CM

## 2021-08-04 NOTE — Progress Notes (Signed)
PATIENT: Melissa White DOB: 04/05/1976  REASON FOR VISIT: follow up HISTORY FROM: patient   HISTORY OF PRESENT ILLNESS: Today 08/04/21 Melissa White is a 45 y.o. female with a history of OSA on CPAP. Her recent home sleep test showed obstructive sleep apnea in the severe range.otal AHI of 65.6/hour and O2 nadir of 81%. She was initiated on CPAP. Initially she was having difficulty with the pressure being too high and requested it be turned down. At this time her pressure was decreased to and auto titration setting between 10 and 13 cmH2O. She is still trying to adjust to it. Patient states she had a throbbing migraine in her forehead that is not relieved by tylenol. This started after she started using the machine, and she feels it is associated with nights that she isn't sleeping well on the CPAP. Otherwise, she can tell when she sleeps well with the machine and she feels great the next day. She states the CPAP works well when she is on her back, but she experiences back pain, so she prefers to sleep on her side.When she sleeps on her side, her mask doesn't fit well. Encouraged patient to try a CPAP pillow.  Her down load for the last 30 days shows that she has worn her CPAP for 21 of those days for a compliance of 70%. Of those 30 days, she wore her machine for >4 hours for 20 of the days for a compliance of 66.7%. She had good treatment on auto titration between 10 and 13cmH2O with an AHI of 1.5.   HISTORY  Copied form Dr. Guadelupe Sabin note 02/10/21 This home sleep test demonstrates severe obstructive sleep apnea with a total AHI of 65.6/hour and O2 nadir of 81%. Snoring was noted and appeared to be in the moderate to loud range. Treatment with positive airway pressure is recommended. The patient will be advised to proceed with an autoPAP titration/trial at home for now. A full night titration study may be considered to optimize treatment settings, if needed down the road. Please note that  untreated obstructive sleep apnea may carry additional perioperative morbidity. Patients with significant obstructive sleep apnea should receive perioperative PAP therapy and the surgeons and particularly the anesthesiologist should be informed of the diagnosis and the severity of the sleep disordered breathing. The patient should be cautioned not to drive, work at heights, or operate dangerous or heavy equipment when tired or sleepy. Review and reiteration of good sleep hygiene measures should be pursued with any patient. Other causes of the patient's symptoms, including circadian rhythm disturbances, an underlying mood disorder, medication effect and/or an underlying medical problem cannot be ruled out based on this test. Clinical correlation is recommended. The patient and her referring provider will be notified of the test results. The patient will be seen in follow up in sleep clinic at [facility].   REVIEW OF SYSTEMS: Out of a complete 14 system review of symptoms, the patient complains only of the following symptoms, and all other reviewed systems are negative.  FSS ESS 3  ALLERGIES: No Known Allergies  HOME MEDICATIONS: Outpatient Medications Prior to Visit  Medication Sig Dispense Refill   BIKTARVY 50-200-25 MG TABS tablet Take 1 tablet by mouth daily.  5   cetirizine (ZYRTEC) 10 MG tablet Take 1 tablet (10 mg total) by mouth daily. 30 tablet 0   ferrous sulfate 325 (65 FE) MG tablet Take 325 mg by mouth daily with breakfast.     ibuprofen (ADVIL) 600 MG  tablet Take 1 tablet (600 mg total) by mouth every 6 (six) hours as needed (for pain). 30 tablet 0   medroxyPROGESTERone Acetate (DEPO-PROVERA IM) Inject into the muscle every 3 (three) months.     omeprazole (PRILOSEC) 40 MG capsule Take 1 capsule (40 mg total) by mouth daily. 30 capsule 3   valACYclovir (VALTREX) 500 MG tablet Take 500 mg by mouth daily.     dicyclomine (BENTYL) 20 MG tablet Take 1 tablet (20 mg total) by mouth 3 (three)  times daily as needed for spasms (abdominal cramping). (Patient not taking: Reported on 08/04/2021) 20 tablet 0   ondansetron (ZOFRAN ODT) 4 MG disintegrating tablet Take 1 tablet (4 mg total) by mouth every 8 (eight) hours as needed. (Patient not taking: Reported on 08/04/2021) 20 tablet 0   senna-docusate (SENOKOT-S) 8.6-50 MG tablet Take 1 tablet by mouth at bedtime as needed for mild constipation or moderate constipation. (Patient not taking: Reported on 08/04/2021) 20 tablet 0   medroxyPROGESTERone (PROVERA) 10 MG tablet Take 10 mg by mouth daily. (Patient not taking: No sig reported)     No facility-administered medications prior to visit.    PAST MEDICAL HISTORY: Past Medical History:  Diagnosis Date   Allergy    Anemia    Fibroids    HIV (human immunodeficiency virus infection) ([place])     PAST SURGICAL HISTORY: Past Surgical History:  Procedure Laterality Date   CESAREAN SECTION     2   IR RADIOLOGIST EVAL & MGMT  12/21/2018   UTERINE FIBROID EMBOLIZATION  03/11/2021    FAMILY HISTORY: Family History  Problem Relation Age of Onset   Diabetes Mother    Arthritis Mother    Hypertension Mother    Kidney disease Father    Hypertension Father     SOCIAL HISTORY: Social History   Socioeconomic History   Marital status: Married    Spouse name: Not on file   Number of children: 3   Years of education: Not on file   Highest education level: Not on file  Occupational History   Not on file  Tobacco Use   Smoking status: Never   Smokeless tobacco: Never  Vaping Use   Vaping Use: Never used  Substance and Sexual Activity   Alcohol use: No   Drug use: No   Sexual activity: Not Currently    Birth control/protection: Injection  Other Topics Concern   Not on file  Social History Narrative   Right handed    Lives in a one story home    Has three sons    Drinks Caffeine every once in awhile    Social Determinants of Health   Financial Resource Strain: Not on  file  Food Insecurity: Not on file  Transportation Needs: Not on file  Physical Activity: Not on file  Stress: Not on file  Social Connections: Not on file  Intimate Partner Violence: Not on file      PHYSICAL EXAM  Vitals:   08/04/21 0848  BP: 139/85  Pulse: 86  Weight: 111.1 kg  Height: 5\' 3"  (1.6 m)   Body mass index is 43.4 kg/m.  Generalized: Well developed, in no acute distress  Chest: Lungs clear to auscultation bilaterally  Neurological examination  Mentation: Alert oriented to time, place, history taking. Follows all commands speech and language fluent Cranial nerve II-XII: Extraocular movements were full, visual field were full on confrontational test Head turning and shoulder shrug  were normal and symmetric. Motor: The  motor testing reveals 5 over 5 strength of all 4 extremities. Good symmetric motor tone is noted throughout.  Sensory: Sensory testing is intact to soft touch on all 4 extremities. No evidence of extinction is noted.  Gait and station: Gait is normal.    DIAGNOSTIC DATA (LABS, IMAGING, TESTING) - I reviewed patient records, labs, notes, testing and imaging myself where available.  Lab Results  Component Value Date   WBC 12.4 (H) 03/13/2021   HGB 10.1 (L) 03/13/2021   HCT 29.9 (L) 03/13/2021   MCV 81.7 03/13/2021   PLT 461 (H) 03/13/2021      Component Value Date/Time   NA 136 03/13/2021 0732   K 3.7 03/13/2021 0732   CL 100 03/13/2021 0732   CO2 26 03/13/2021 0732   GLUCOSE 119 (H) 03/13/2021 0732   BUN 7 03/13/2021 0732   CREATININE 0.64 03/13/2021 0732   CALCIUM 8.8 (L) 03/13/2021 0732   PROT 8.2 (H) 03/13/2021 0732   ALBUMIN 3.4 (L) 03/13/2021 0732   AST 24 03/13/2021 0732   ALT 13 03/13/2021 0732   ALKPHOS 71 03/13/2021 0732   BILITOT 0.5 03/13/2021 0732   GFRNONAA >60 03/13/2021 0732   GFRAA >60 08/07/2019 1819   Lab Results  Component Value Date   CHOL 200 08/01/2018   HDL 55.60 08/01/2018   LDLCALC 105 (H)  08/01/2018   TRIG 196.0 (H) 08/01/2018   CHOLHDL 4 08/01/2018   Lab Results  Component Value Date   HGBA1C 5.3 09/09/2020   Lab Results  Component Value Date   VITAMINB12 327 08/23/2020   Lab Results  Component Value Date   TSH 1.77 09/09/2020      ASSESSMENT AND PLAN 45 y.o. year old female  has a past medical history of Allergy, Anemia, Fibroids, and HIV (human immunodeficiency virus infection) ([place]). here with:  OSA on CPAP  - CPAP compliance fair - Good treatment of AHI  - Encourage patient to use CPAP nightly and > 4 hours each night - recommended a CPAP pillow or changing mask - F/U in 6 months or sooner if needed     Ward Givens, MSN, NP-C 08/04/2021, 8:56 AM [facility] [address], [place], [place] [address] [phone]

## 2021-08-05 ENCOUNTER — Ambulatory Visit: Payer: BC Managed Care – PPO | Admitting: Neurology

## 2021-08-15 DIAGNOSIS — E669 Obesity, unspecified: Secondary | ICD-10-CM | POA: Diagnosis not present

## 2021-08-15 DIAGNOSIS — D251 Intramural leiomyoma of uterus: Secondary | ICD-10-CM | POA: Diagnosis not present

## 2021-08-15 DIAGNOSIS — Z01411 Encounter for gynecological examination (general) (routine) with abnormal findings: Secondary | ICD-10-CM | POA: Diagnosis not present

## 2021-08-15 DIAGNOSIS — R399 Unspecified symptoms and signs involving the genitourinary system: Secondary | ICD-10-CM | POA: Diagnosis not present

## 2021-08-15 DIAGNOSIS — Z6841 Body Mass Index (BMI) 40.0 and over, adult: Secondary | ICD-10-CM | POA: Diagnosis not present

## 2021-09-03 DIAGNOSIS — D219 Benign neoplasm of connective and other soft tissue, unspecified: Secondary | ICD-10-CM | POA: Diagnosis not present

## 2021-09-04 DIAGNOSIS — Z1231 Encounter for screening mammogram for malignant neoplasm of breast: Secondary | ICD-10-CM | POA: Diagnosis not present

## 2021-09-05 DIAGNOSIS — Z3042 Encounter for surveillance of injectable contraceptive: Secondary | ICD-10-CM | POA: Diagnosis not present

## 2021-09-08 DIAGNOSIS — H2513 Age-related nuclear cataract, bilateral: Secondary | ICD-10-CM | POA: Diagnosis not present

## 2021-09-08 DIAGNOSIS — H40013 Open angle with borderline findings, low risk, bilateral: Secondary | ICD-10-CM | POA: Diagnosis not present

## 2021-09-08 DIAGNOSIS — B20 Human immunodeficiency virus [HIV] disease: Secondary | ICD-10-CM | POA: Diagnosis not present

## 2021-09-08 DIAGNOSIS — H04123 Dry eye syndrome of bilateral lacrimal glands: Secondary | ICD-10-CM | POA: Diagnosis not present

## 2021-11-17 ENCOUNTER — Ambulatory Visit (INDEPENDENT_AMBULATORY_CARE_PROVIDER_SITE_OTHER): Payer: BC Managed Care – PPO | Admitting: Family Medicine

## 2021-11-17 ENCOUNTER — Other Ambulatory Visit: Payer: Self-pay

## 2021-11-17 ENCOUNTER — Ambulatory Visit (HOSPITAL_BASED_OUTPATIENT_CLINIC_OR_DEPARTMENT_OTHER)
Admission: RE | Admit: 2021-11-17 | Discharge: 2021-11-17 | Disposition: A | Discharge status: Home / Self Care | Payer: BC Managed Care – PPO | Source: Ambulatory Visit | Attending: Family Medicine | Admitting: Family Medicine

## 2021-11-17 ENCOUNTER — Encounter: Payer: Self-pay | Admitting: Family Medicine

## 2021-11-17 VITALS — BP 144/85 | HR 103 | Temp 98.0°F | Ht 63.0 in | Wt 247.6 lb

## 2021-11-17 DIAGNOSIS — D259 Leiomyoma of uterus, unspecified: Secondary | ICD-10-CM | POA: Diagnosis not present

## 2021-11-17 DIAGNOSIS — N858 Other specified noninflammatory disorders of uterus: Secondary | ICD-10-CM | POA: Diagnosis not present

## 2021-11-17 DIAGNOSIS — R350 Frequency of micturition: Secondary | ICD-10-CM | POA: Insufficient documentation

## 2021-11-17 DIAGNOSIS — N200 Calculus of kidney: Secondary | ICD-10-CM | POA: Diagnosis not present

## 2021-11-17 DIAGNOSIS — M545 Low back pain, unspecified: Secondary | ICD-10-CM | POA: Diagnosis not present

## 2021-11-17 LAB — CBC
HCT: 39.4 % (ref 36.0–46.0)
Hemoglobin: 13.1 g/dL (ref 12.0–15.0)
MCHC: 33.1 g/dL (ref 30.0–36.0)
MCV: 82.3 fl (ref 78.0–100.0)
Platelets: 253 10*3/uL (ref 150.0–400.0)
RBC: 4.79 Mil/uL (ref 3.87–5.11)
RDW: 14 % (ref 11.5–15.5)
WBC: 3.7 10*3/uL — ABNORMAL LOW (ref 4.0–10.5)

## 2021-11-17 LAB — POCT URINALYSIS DIPSTICK
Bilirubin, UA: NEGATIVE
Glucose, UA: NEGATIVE
Ketones, UA: NEGATIVE
Leukocytes, UA: NEGATIVE
Nitrite, UA: NEGATIVE
Protein, UA: POSITIVE — AB
Spec Grav, UA: 1.025 (ref 1.010–1.025)
Urobilinogen, UA: 0.2 E.U./dL
pH, UA: 6 (ref 5.0–8.0)

## 2021-11-17 LAB — COMPREHENSIVE METABOLIC PANEL
ALT: 14 U/L (ref 0–35)
AST: 14 U/L (ref 0–37)
Albumin: 3.9 g/dL (ref 3.5–5.2)
Alkaline Phosphatase: 66 U/L (ref 39–117)
BUN: 11 mg/dL (ref 6–23)
CO2: 26 mEq/L (ref 19–32)
Calcium: 9 mg/dL (ref 8.4–10.5)
Chloride: 104 mEq/L (ref 96–112)
Creatinine, Ser: 0.72 mg/dL (ref 0.40–1.20)
GFR: 101.14 mL/min (ref >60.00)
Glucose, Bld: 88 mg/dL (ref 70–99)
Potassium: 3.9 mEq/L (ref 3.5–5.1)
Sodium: 138 mEq/L (ref 135–145)
Total Bilirubin: 0.5 mg/dL (ref 0.2–1.2)
Total Protein: 7.3 g/dL (ref 6.0–8.3)

## 2021-11-17 LAB — URINALYSIS, MICROSCOPIC ONLY

## 2021-11-17 NOTE — Progress Notes (Signed)
Acute Office Visit  Subjective:    Patient ID: Melissa White, female    DOB: Oct 24, 1975, 46 y.o.   MRN: [id number]  Chief Complaint  Patient presents with   Back Pain    Back Pain  Patient is in today for low back pain.  Patient states that for the past 2 weeks she has had urinary frequency, stomach feeling uncomfortable/bloated, middle low back pain 5 out of 10 that tends to radiate around her right flank and right lower quadrant.  She describes the pain as 5 out of 10 stabbing discomfort that tends to be worse with walking and moving around.  She has not gotten any improvement with Tylenol or Aleve.  She denies any trauma, injuries, falls, fevers.  Reports that she had surgery on uterine fibroids last May.  She has also had a few days of vaginal spotting, but reports she is due for her next Depo shot in about a week.  She is going to make sure this is not a kidney infection or her fibroids acting up.     Past Medical History:  Diagnosis Date   Allergy    Anemia    Fibroids    HIV (human immunodeficiency virus infection) ([place])     Past Surgical History:  Procedure Laterality Date   CESAREAN SECTION     2   IR RADIOLOGIST EVAL & MGMT  12/21/2018   UTERINE FIBROID EMBOLIZATION  03/11/2021    Family History  Problem Relation Age of Onset   Diabetes Mother    Arthritis Mother    Hypertension Mother    Kidney disease Father    Hypertension Father     Social History   Socioeconomic History   Marital status: Married    Spouse name: Not on file   Number of children: 3   Years of education: Not on file   Highest education level: Not on file  Occupational History   Not on file  Tobacco Use   Smoking status: Never   Smokeless tobacco: Never  Vaping Use   Vaping Use: Never used  Substance and Sexual Activity   Alcohol use: No   Drug use: No   Sexual activity: Not Currently    Birth control/protection: Injection  Other Topics Concern   Not on file  Social  History Narrative   Right handed    Lives in a one story home    Has three sons    Drinks Caffeine every once in awhile    Social Determinants of Health   Financial Resource Strain: Not on file  Food Insecurity: Not on file  Transportation Needs: Not on file  Physical Activity: Not on file  Stress: Not on file  Social Connections: Not on file  Intimate Partner Violence: Not on file    Outpatient Medications Prior to Visit  Medication Sig Dispense Refill   BIKTARVY 50-200-25 MG TABS tablet Take 1 tablet by mouth daily.  5   cetirizine (ZYRTEC) 10 MG tablet Take 1 tablet (10 mg total) by mouth daily. 30 tablet 0   ferrous sulfate 325 (65 FE) MG tablet Take 325 mg by mouth daily with breakfast.     ibuprofen (ADVIL) 600 MG tablet Take 1 tablet (600 mg total) by mouth every 6 (six) hours as needed (for pain). 30 tablet 0   medroxyPROGESTERone Acetate (DEPO-PROVERA IM) Inject into the muscle every 3 (three) months.     omeprazole (PRILOSEC) 40 MG capsule Take 1 capsule (40 mg total)  by mouth daily. 30 capsule 3   ondansetron (ZOFRAN ODT) 4 MG disintegrating tablet Take 1 tablet (4 mg total) by mouth every 8 (eight) hours as needed. 20 tablet 0   valACYclovir (VALTREX) 500 MG tablet Take 500 mg by mouth daily.     dicyclomine (BENTYL) 20 MG tablet Take 1 tablet (20 mg total) by mouth 3 (three) times daily as needed for spasms (abdominal cramping). 20 tablet 0   senna-docusate (SENOKOT-S) 8.6-50 MG tablet Take 1 tablet by mouth at bedtime as needed for mild constipation or moderate constipation. (Patient not taking: Reported on 08/04/2021) 20 tablet 0   No facility-administered medications prior to visit.    No Known Allergies  Review of Systems All review of systems negative except what is listed in the HPI      Objective:    Physical Exam Vitals reviewed.  Constitutional:      Appearance: Normal appearance. She is obese.  HENT:     Head: Normocephalic and atraumatic.   Cardiovascular:     Rate and Rhythm: Normal rate and regular rhythm.  Pulmonary:     Effort: Pulmonary effort is normal.     Breath sounds: Normal breath sounds.  Abdominal:     General: There is no distension.     Palpations: There is no mass.     Tenderness: There is no abdominal tenderness. There is right CVA tenderness and left CVA tenderness. There is no guarding or rebound.     Hernia: No hernia is present.  Musculoskeletal:        General: Normal range of motion.  Skin:    General: Skin is warm and dry.  Neurological:     General: No focal deficit present.     Mental Status: She is alert and oriented to person, place, and time. Mental status is at baseline.  Psychiatric:        Mood and Affect: Mood normal.        Behavior: Behavior normal.        Thought Content: Thought content normal.        Judgment: Judgment normal.    BP (!) 144/85    Pulse (!) 103    Temp 98 F (36.7 C)    Ht 5\' 3"  (1.6 m)    Wt 247 lb 9.6 oz (112.3 kg)    SpO2 100%    BMI 43.86 kg/m  Wt Readings from Last 3 Encounters:  11/17/21 247 lb 9.6 oz (112.3 kg)  08/04/21 245 lb (111.1 kg)  03/13/21 240 lb 4.8 oz (109 kg)    Health Maintenance Due  Topic Date Due   COVID-19 Vaccine (1) Never done   Hepatitis C Screening  Never done   PAP SMEAR-Modifier  05/26/2020   TETANUS/TDAP  12/28/2020   COLONOSCOPY (Pts 45-13yrs Insurance coverage will need to be confirmed)  Never done    There are no preventive care reminders to display for this patient.   Lab Results  Component Value Date   TSH 1.77 09/09/2020   Lab Results  Component Value Date   WBC 12.4 (H) 03/13/2021   HGB 10.1 (L) 03/13/2021   HCT 29.9 (L) 03/13/2021   MCV 81.7 03/13/2021   PLT 461 (H) 03/13/2021   Lab Results  Component Value Date   NA 136 03/13/2021   K 3.7 03/13/2021   CO2 26 03/13/2021   GLUCOSE 119 (H) 03/13/2021   BUN 7 03/13/2021   CREATININE 0.64 03/13/2021   BILITOT 0.5  03/13/2021   ALKPHOS 71 03/13/2021    AST 24 03/13/2021   ALT 13 03/13/2021   PROT 8.2 (H) 03/13/2021   ALBUMIN 3.4 (L) 03/13/2021   CALCIUM 8.8 (L) 03/13/2021   ANIONGAP 10 03/13/2021   GFR 101.99 09/09/2020   Lab Results  Component Value Date   CHOL 200 08/01/2018   Lab Results  Component Value Date   HDL 55.60 08/01/2018   Lab Results  Component Value Date   LDLCALC 105 (H) 08/01/2018   Lab Results  Component Value Date   TRIG 196.0 (H) 08/01/2018   Lab Results  Component Value Date   CHOLHDL 4 08/01/2018   Lab Results  Component Value Date   HGBA1C 5.3 09/09/2020       Assessment & Plan:   Acute low back pain without sciatica, unspecified back pain laterality 2.  Increased urinary frequency UA with large blood (states she has had some light spotting off and on for the past 2 days - she is almost due for depo shot and also reports history of fibroids with UFE last summer). Will send for micro and culture. Given degree of lower back discomfort, radiating to right groin and hematuria (difficult to differentiate due to some spotting) and CT in 02/2021 showing a right nephrolithiasis, will go ahead and CT scan today to rule out stone. Will update her with results and plan. Adding CBC/CMP. Patient aware of signs/symptoms requiring further/urgent evaluation.    - POCT Urinalysis Dipstick - Urine Culture - Urine Microscopic Only - CBC - Comprehensive metabolic panel - CT RENAL STONE STUDY; Future    Follow-up pending results or as needed.    Terrilyn Saver, NP

## 2021-11-18 ENCOUNTER — Encounter: Payer: Self-pay | Admitting: Family Medicine

## 2021-11-18 LAB — URINE CULTURE
MICRO NUMBER:: 12936896
Result:: NO GROWTH
SPECIMEN QUALITY:: ADEQUATE

## 2021-11-19 ENCOUNTER — Ambulatory Visit: Payer: BC Managed Care – PPO | Admitting: Family Medicine

## 2021-11-26 DIAGNOSIS — Z3042 Encounter for surveillance of injectable contraceptive: Secondary | ICD-10-CM | POA: Diagnosis not present

## 2021-12-23 ENCOUNTER — Other Ambulatory Visit: Payer: Self-pay

## 2021-12-23 ENCOUNTER — Ambulatory Visit (INDEPENDENT_AMBULATORY_CARE_PROVIDER_SITE_OTHER): Payer: BC Managed Care – PPO | Admitting: Orthopaedic Surgery

## 2021-12-23 ENCOUNTER — Encounter: Payer: Self-pay | Admitting: Orthopaedic Surgery

## 2021-12-23 DIAGNOSIS — G5602 Carpal tunnel syndrome, left upper limb: Secondary | ICD-10-CM | POA: Diagnosis not present

## 2021-12-23 DIAGNOSIS — G5601 Carpal tunnel syndrome, right upper limb: Secondary | ICD-10-CM

## 2021-12-23 DIAGNOSIS — G5603 Carpal tunnel syndrome, bilateral upper limbs: Secondary | ICD-10-CM | POA: Diagnosis not present

## 2021-12-23 HISTORY — DX: Carpal tunnel syndrome, right upper limb: G56.01

## 2021-12-23 NOTE — Progress Notes (Signed)
? ?  Office Visit Note ?  ?Patient: Melissa White           ?Date of Birth: [birth year]           ?MRN: [id number] ?Visit Date: 12/23/2021 ?             ?Requested by: Melissa Mclean, MD ?[address] ?[address] ?[place],  [place] [address] ?PCP: Melissa Mclean, MD ? ? ?Assessment & Plan: ?Visit Diagnoses:  ?1. Right carpal tunnel syndrome   ?2. Left carpal tunnel syndrome   ? ? ?Plan: Impression is bilateral severe carpal tunnel syndrome.  Treatment options were reviewed with the patient again and recommendation has been made for right carpal tunnel release and then follow via left carpal tunnel release.  Risks benefits rehab recovery prognosis reviewed with the patient detail.  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  Jackelyn Poling will call the patient to schedule surgery for sometime in April. ? ?Follow-Up Instructions: No follow-ups on file.  ? ?Orders:  ?No orders of the defined types were placed in this encounter. ? ?No orders of the defined types were placed in this encounter. ? ? ? ? Procedures: ?No procedures performed ? ? ?Clinical Data: ?No additional findings. ? ? ?Subjective: ?Chief Complaint  ?Patient presents with  ? Left Wrist - Pain, Numbness, Tingling  ? Right Wrist - Pain  ? ? ?HPI ? ?Ms. White returns today for follow-up of bilateral carpal tunnel syndrome.  Her symptoms have gotten worse.  We saw her last summer and postpone surgery because she was having gynecologic issues that have now resolved.  She is unable to sleep at night due to constant pain and burning and numbness and tingling. ? ?Review of Systems ? ? ?Objective: ?Vital Signs: There were no vitals taken for this visit. ? ?Physical Exam ? ?Ortho Exam ? ?Bilateral hand exams are unchanged. ? ?Specialty Comments:  ?No specialty comments available. ? ?Imaging: ?No results found. ? ? ?PMFS History: ?Patient Active Problem List  ? Diagnosis Date Noted  ?  Right carpal tunnel syndrome 12/23/2021  ? Left carpal tunnel syndrome 12/23/2021  ? Symptomatic anemia 03/01/2021  ? Low back pain 08/21/2019  ? Costochondritis 08/08/2019  ? HIV positive ([place]) 06/16/2017  ? Menstrual cramps 06/16/2017  ? ?Past Medical History:  ?Diagnosis Date  ? Allergy   ? Anemia   ? Fibroids   ? HIV (human immunodeficiency virus infection) ([place])   ? Right carpal tunnel syndrome 12/23/2021  ?  ?Family History  ?Problem Relation Age of Onset  ? Diabetes Mother   ? Arthritis Mother   ? Hypertension Mother   ? Kidney disease Father   ? Hypertension Father   ?  ?Past Surgical History:  ?Procedure Laterality Date  ? CESAREAN SECTION    ? 2  ? IR RADIOLOGIST EVAL & MGMT  12/21/2018  ? UTERINE FIBROID EMBOLIZATION  03/11/2021  ? ?Social History  ? ?Occupational History  ? Not on file  ?Tobacco Use  ? Smoking status: Never  ? Smokeless tobacco: Never  ?Vaping Use  ? Vaping Use: Never used  ?Substance and Sexual Activity  ? Alcohol use: No  ? Drug use: No  ? Sexual activity: Not Currently  ?  Birth control/protection: Injection  ? ? ? ? ? ? ?

## 2022-01-13 DIAGNOSIS — G4733 Obstructive sleep apnea (adult) (pediatric): Secondary | ICD-10-CM | POA: Diagnosis not present

## 2022-01-22 ENCOUNTER — Telehealth: Payer: Self-pay | Admitting: Orthopaedic Surgery

## 2022-01-22 NOTE — Telephone Encounter (Signed)
Patient is scheduled for carpal tunnel release at [facility] with Dr. Erlinda Hong  02-12-22.  She would like to have surgery on the LEFT  hand first (instead of the right) because it is giving her the most trouble.  Per Dr. Erlinda Hong surgery order was changed to LEFT.  ? ?

## 2022-01-23 NOTE — Telephone Encounter (Signed)
Yes that's fine 

## 2022-01-26 ENCOUNTER — Telehealth: Payer: Self-pay

## 2022-01-26 NOTE — Telephone Encounter (Signed)
Patient came in and paid her 75 and filled our her medical release form for [facility] ?

## 2022-02-03 ENCOUNTER — Telehealth: Payer: BC Managed Care – PPO | Admitting: Adult Health

## 2022-02-06 DIAGNOSIS — Z23 Encounter for immunization: Secondary | ICD-10-CM | POA: Diagnosis not present

## 2022-02-06 DIAGNOSIS — B20 Human immunodeficiency virus [HIV] disease: Secondary | ICD-10-CM | POA: Diagnosis not present

## 2022-02-09 ENCOUNTER — Other Ambulatory Visit: Payer: Self-pay | Admitting: Physician Assistant

## 2022-02-09 MED ORDER — ONDANSETRON HCL 4 MG PO TABS: 4.0000 mg | ORAL_TABLET | Freq: Three times a day (TID) | ORAL | 0 refills | Status: DC | PRN

## 2022-02-09 MED ORDER — HYDROCODONE-ACETAMINOPHEN 5-325 MG PO TABS: 1.0000 | ORAL_TABLET | Freq: Three times a day (TID) | ORAL | 0 refills | Status: DC | PRN

## 2022-02-12 ENCOUNTER — Other Ambulatory Visit: Payer: Self-pay | Admitting: Physician Assistant

## 2022-02-12 DIAGNOSIS — G5602 Carpal tunnel syndrome, left upper limb: Secondary | ICD-10-CM | POA: Diagnosis not present

## 2022-02-13 ENCOUNTER — Telehealth (INDEPENDENT_AMBULATORY_CARE_PROVIDER_SITE_OTHER): Payer: BC Managed Care – PPO | Admitting: Adult Health

## 2022-02-13 DIAGNOSIS — G4733 Obstructive sleep apnea (adult) (pediatric): Secondary | ICD-10-CM

## 2022-02-13 DIAGNOSIS — Z9989 Dependence on other enabling machines and devices: Secondary | ICD-10-CM

## 2022-02-13 NOTE — Progress Notes (Signed)
? ? ? ?PATIENT: Melissa White ?DOB: December 23, 1975 ? ?REASON FOR VISIT: follow up ?HISTORY FROM: patient ?PRIMARY NEUROLOGIST: Dr. Rexene Alberts ? ?Virtual Visit via Video Note ? ?I connected with Gerilyn Nestle on 02/13/22 at 11:45 AM EDT by a video enabled telemedicine application located remotely at [facility] and verified that I am speaking with the correct person using two identifiers who was located at their own home. ?  ?I discussed the limitations of evaluation and management by telemedicine and the availability of in person appointments. The patient expressed understanding and agreed to proceed. ? ? ?PATIENT: Melissa White ?DOB: [birth year] ? ?REASON FOR VISIT: follow up ?HISTORY FROM: patient ? ?HISTORY OF PRESENT ILLNESS: ?Today 02/13/22: ? ?Ms. Knotek is a 46 year old female with a history of obstructive sleep apnea on CPAP.  She returns today for follow-up.  Her download indicates that she used her machine 24 out of 30 days for compliance of 80%.  She used her machine greater than 4 hours for compliance of 73.3%.  On average she uses her machine 5 hours and 25 minutes most nights.  Her residual AHI is 1.3 with her average pressure being 10.6 cm of water.  She denies any new issues.  She returns today for an evaluation. ? ? ?REVIEW OF SYSTEMS: Out of a complete 14 system review of symptoms, the patient complains only of the following symptoms, and all other reviewed systems are negative. ? ?ALLERGIES: ?No Known Allergies ? ?HOME MEDICATIONS: ?Outpatient Medications Prior to Visit  ?Medication Sig Dispense Refill  ? BIKTARVY 50-200-25 MG TABS tablet Take 1 tablet by mouth daily.  5  ? cetirizine (ZYRTEC) 10 MG tablet Take 1 tablet (10 mg total) by mouth daily. 30 tablet 0  ? ferrous sulfate 325 (65 FE) MG tablet Take 325 mg by mouth daily with breakfast.    ? HYDROcodone-acetaminophen (NORCO) 5-325 MG tablet Take 1 tablet by mouth 3 (three) times daily as needed. To be taken after surgery 20  tablet 0  ? ibuprofen (ADVIL) 600 MG tablet Take 1 tablet (600 mg total) by mouth every 6 (six) hours as needed (for pain). 30 tablet 0  ? medroxyPROGESTERone Acetate (DEPO-PROVERA IM) Inject into the muscle every 3 (three) months.    ? omeprazole (PRILOSEC) 40 MG capsule Take 1 capsule (40 mg total) by mouth daily. 30 capsule 3  ? ondansetron (ZOFRAN ODT) 4 MG disintegrating tablet Take 1 tablet (4 mg total) by mouth every 8 (eight) hours as needed. 20 tablet 0  ? ondansetron (ZOFRAN) 4 MG tablet Take 1 tablet (4 mg total) by mouth every 8 (eight) hours as needed for nausea or vomiting. 40 tablet 0  ? valACYclovir (VALTREX) 500 MG tablet Take 500 mg by mouth daily.    ? ?No facility-administered medications prior to visit.  ? ? ?PAST MEDICAL HISTORY: ?Past Medical History:  ?Diagnosis Date  ? Allergy   ? Anemia   ? Fibroids   ? HIV (human immunodeficiency virus infection) ([place])   ? Right carpal tunnel syndrome 12/23/2021  ? ? ?PAST SURGICAL HISTORY: ?Past Surgical History:  ?Procedure Laterality Date  ? CESAREAN SECTION    ? 2  ? IR RADIOLOGIST EVAL & MGMT  12/21/2018  ? UTERINE FIBROID EMBOLIZATION  03/11/2021  ? ? ?FAMILY HISTORY: ?Family History  ?Problem Relation Age of Onset  ? Diabetes Mother   ? Arthritis Mother   ? Hypertension Mother   ? Kidney disease Father   ? Hypertension Father   ? ? ?  SOCIAL HISTORY: ?Social History  ? ?Socioeconomic History  ? Marital status: Married  ?  Spouse name: Not on file  ? Number of children: 3  ? Years of education: Not on file  ? Highest education level: Not on file  ?Occupational History  ? Not on file  ?Tobacco Use  ? Smoking status: Never  ? Smokeless tobacco: Never  ?Vaping Use  ? Vaping Use: Never used  ?Substance and Sexual Activity  ? Alcohol use: No  ? Drug use: No  ? Sexual activity: Not Currently  ?  Birth control/protection: Injection  ?Other Topics Concern  ? Not on file  ?Social History Narrative  ? Right handed   ? Lives in a one story home   ? Has three sons    ? Drinks Caffeine every once in awhile   ? ?Social Determinants of Health  ? ?Financial Resource Strain: Not on file  ?Food Insecurity: Not on file  ?Transportation Needs: Not on file  ?Physical Activity: Not on file  ?Stress: Not on file  ?Social Connections: Not on file  ?Intimate Partner Violence: Not on file  ? ? ? ? ?PHYSICAL EXAM ?Generalized: Well developed, in no acute distress  ? ?Neurological examination  ?Mentation: Alert oriented to time, place, history taking. Follows all commands speech and language fluent ?Cranial nerve II-XII:Extraocular movements were full. Facial symmetry noted. uvula tongue midline. Head turning and shoulder shrug  were normal and symmetric. ?Motor: Good strength throughout subjectively per patient ?Sensory: Sensory testing is intact to soft touch on all 4 extremities subjectively per patient ?Coordination: Cerebellar testing reveals good finger-nose-finger  ?Gait and station: Patient is able to stand from a seated position. gait is normal.  ?Reflexes: UTA ? ?DIAGNOSTIC DATA (LABS, IMAGING, TESTING) ?- I reviewed patient records, labs, notes, testing and imaging myself where available. ? ?Lab Results  ?Component Value Date  ? WBC 3.7 (L) 11/17/2021  ? HGB 13.1 11/17/2021  ? HCT 39.4 11/17/2021  ? MCV 82.3 11/17/2021  ? PLT 253.0 11/17/2021  ? ?   ?Component Value Date/Time  ? NA 138 11/17/2021 1001  ? K 3.9 11/17/2021 1001  ? CL 104 11/17/2021 1001  ? CO2 26 11/17/2021 1001  ? GLUCOSE 88 11/17/2021 1001  ? BUN 11 11/17/2021 1001  ? CREATININE 0.72 11/17/2021 1001  ? CALCIUM 9.0 11/17/2021 1001  ? PROT 7.3 11/17/2021 1001  ? ALBUMIN 3.9 11/17/2021 1001  ? AST 14 11/17/2021 1001  ? ALT 14 11/17/2021 1001  ? ALKPHOS 66 11/17/2021 1001  ? BILITOT 0.5 11/17/2021 1001  ? GFRNONAA >60 03/13/2021 0732  ? GFRAA >60 08/07/2019 1819  ? ?Lab Results  ?Component Value Date  ? CHOL 200 08/01/2018  ? HDL 55.60 08/01/2018  ? LDLCALC 105 (H) 08/01/2018  ? TRIG 196.0 (H) 08/01/2018  ? CHOLHDL 4  08/01/2018  ? ?Lab Results  ?Component Value Date  ? HGBA1C 5.3 09/09/2020  ? ?Lab Results  ?Component Value Date  ? UMPNTIRW43 327 08/23/2020  ? ?Lab Results  ?Component Value Date  ? TSH 1.77 09/09/2020  ? ? ? ? ?ASSESSMENT AND PLAN ?46 y.o. year old female  has a past medical history of Allergy, Anemia, Fibroids, HIV (human immunodeficiency virus infection) ([place]), and Right carpal tunnel syndrome (12/23/2021). here with: ? ?OSA on CPAP ? ?CPAP compliance excellent ?Residual AHI is good ?Encouraged patient to continue using CPAP nightly and > 4 hours each night ?F/U in 1 year or sooner if needed ? ?I spent  20 minutes of face-to-face and non-face-to-face time with patient.  This included previsit chart review, lab review, study review, order entry, electronic health record documentation, patient education. ? ?Ward Givens, MSN, NP-C 02/13/2022, 11:58 AM ?[facility] ?[address], [address] ?[place], [place] [address] ?[phone] ? ?

## 2022-02-16 DIAGNOSIS — Z3042 Encounter for surveillance of injectable contraceptive: Secondary | ICD-10-CM | POA: Diagnosis not present

## 2022-02-17 ENCOUNTER — Encounter (HOSPITAL_BASED_OUTPATIENT_CLINIC_OR_DEPARTMENT_OTHER): Payer: Self-pay

## 2022-02-17 ENCOUNTER — Other Ambulatory Visit: Payer: Self-pay

## 2022-02-17 ENCOUNTER — Emergency Department (HOSPITAL_BASED_OUTPATIENT_CLINIC_OR_DEPARTMENT_OTHER)
Admission: EM | Admit: 2022-02-17 | Discharge: 2022-02-17 | Disposition: A | Discharge status: Home / Self Care | Payer: BC Managed Care – PPO | Attending: Emergency Medicine | Admitting: Emergency Medicine

## 2022-02-17 ENCOUNTER — Emergency Department (HOSPITAL_BASED_OUTPATIENT_CLINIC_OR_DEPARTMENT_OTHER): Payer: BC Managed Care – PPO

## 2022-02-17 DIAGNOSIS — M25511 Pain in right shoulder: Secondary | ICD-10-CM | POA: Insufficient documentation

## 2022-02-17 MED ORDER — OXYCODONE-ACETAMINOPHEN 5-325 MG PO TABS: 1.0000 | ORAL_TABLET | Freq: Four times a day (QID) | ORAL | 0 refills | Status: DC | PRN

## 2022-02-17 NOTE — ED Triage Notes (Signed)
Pt states right shoulder pain and decreased ROM since Friday.  Taking aleve without relief.  Had recent carpal tunnel surgery on left. ?

## 2022-02-17 NOTE — ED Provider Notes (Signed)
?[facility] EMERGENCY DEPARTMENT ?Provider Note ? ? ?CSN: [id number] ?Arrival date & time: 02/17/22  0747 ? ?  ? ?History ? ?Chief Complaint  ?Patient presents with  ? Shoulder Pain  ? ? ?Loreley Schwall is a 46 y.o. female.  Is complaining of severe right shoulder pain that is been going on for a few days.  She recently had left carpal tunnel surgery is in a splint.  No known trauma to her right shoulder.  Pain is worse when trying to abduct.  No numbness or weakness.  No swelling.  No neck pain.  No problems at elbow wrist or hand. ? ?The history is provided by the patient.  ?Shoulder Pain ?Location:  Shoulder ?Shoulder location:  R shoulder ?Injury: no   ?Pain details:  ?  Quality:  Aching ?  Severity:  Severe ?  Onset quality:  Gradual ?  Duration:  4 days ?  Timing:  Constant ?  Progression:  Unchanged ?Handedness:  Right-handed ?Relieved by:  Nothing ?Worsened by:  Movement ?Ineffective treatments:  NSAIDs ?Associated symptoms: no back pain, no fever, no muscle weakness, no numbness and no swelling   ? ?  ? ?Home Medications ?Prior to Admission medications   ?Medication Sig Start Date End Date Taking? Authorizing Provider  ?BIKTARVY 50-200-25 MG TABS tablet Take 1 tablet by mouth daily. 12/17/17   [provider]  ?cetirizine (ZYRTEC) 10 MG tablet Take 1 tablet (10 mg total) by mouth daily. 08/02/16   Gloriann Loan, PA-C  ?ferrous sulfate 325 (65 FE) MG tablet Take 325 mg by mouth daily with breakfast.    [provider]  ?HYDROcodone-acetaminophen (NORCO) 5-325 MG tablet Take 1 tablet by mouth 3 (three) times daily as needed. To be taken after surgery 02/09/22   Aundra Dubin, PA-C  ?ibuprofen (ADVIL) 600 MG tablet Take 1 tablet (600 mg total) by mouth every 6 (six) hours as needed (for pain). 03/02/21   Woodroe Mode, MD  ?medroxyPROGESTERone Acetate (DEPO-PROVERA IM) Inject into the muscle every 3 (three) months.    [provider]  ?omeprazole (PRILOSEC) 40 MG capsule Take  1 capsule (40 mg total) by mouth daily. 10/22/20   Debbrah Alar, NP  ?ondansetron (ZOFRAN ODT) 4 MG disintegrating tablet Take 1 tablet (4 mg total) by mouth every 8 (eight) hours as needed. 03/13/21   Long, Wonda Olds, MD  ?ondansetron (ZOFRAN) 4 MG tablet Take 1 tablet (4 mg total) by mouth every 8 (eight) hours as needed for nausea or vomiting. 02/09/22   Aundra Dubin, PA-C  ?valACYclovir (VALTREX) 500 MG tablet Take 500 mg by mouth daily. 09/13/18   [provider]  ?   ? ?Allergies    ?Patient has no known allergies.   ? ?Review of Systems   ?Review of Systems  ?Constitutional:  Negative for fever.  ?Musculoskeletal:  Negative for back pain.  ? ?Physical Exam ?Updated Vital Signs ?BP 132/77   Pulse 100   Temp 98.2 ?F (36.8 ?C)   Resp 16   Ht '5\' 3"'$  (1.6 m)   Wt 112 kg   SpO2 99%   BMI 43.75 kg/m?  ?Physical Exam ?Vitals and nursing note reviewed.  ?Constitutional:   ?   General: She is not in acute distress. ?   Appearance: Normal appearance. She is well-developed.  ?HENT:  ?   Head: Normocephalic and atraumatic.  ?Eyes:  ?   Conjunctiva/sclera: Conjunctivae normal.  ?Cardiovascular:  ?   Rate and Rhythm:  Normal rate and regular rhythm.  ?   Heart sounds: No murmur heard. ?Pulmonary:  ?   Effort: Pulmonary effort is normal. No respiratory distress.  ?   Breath sounds: Normal breath sounds.  ?Abdominal:  ?   Palpations: Abdomen is soft.  ?   Tenderness: There is no abdominal tenderness.  ?Musculoskeletal:     ?   General: Tenderness present. No swelling or deformity.  ?   Cervical back: Neck supple.  ?   Comments: Left wrist in splint. ?Right shoulder diffuse tenderness anteriorly.  Limited range of motion secondary to pain.  Elbow wrist hand nontender full range of motion good strength.  Distal pulses sensation motor intact.  No cords appreciated  ?Skin: ?   General: Skin is warm and dry.  ?   Capillary Refill: Capillary refill takes less than 2 seconds.  ?Neurological:  ?   General: No  focal deficit present.  ?   Mental Status: She is alert.  ?   Sensory: No sensory deficit.  ?   Motor: No weakness.  ? ? ?ED Results / Procedures / Treatments   ?Labs ?(all labs ordered are listed, but only abnormal results are displayed) ?Labs Reviewed - No data to display ? ?EKG ?None ? ?Radiology ?DG Shoulder Right ? ?Result Date: 02/17/2022 ?CLINICAL DATA:  Shoulder pain. Shoulder pain and decreased range of motion since Friday. EXAM: RIGHT SHOULDER - 2+ VIEW COMPARISON:  Chest radiographs 08/07/2019. FINDINGS: There is normal bony alignment. No evidence of acute osseous or articular abnormality. The joint spaces are maintained. IMPRESSION: No evidence of acute osseous or articular abnormality. Electronically Signed   By: Kellie Simmering D.O.   On: 02/17/2022 08:51   ? ?Procedures ?Procedures  ? ? ?Medications Ordered in ED ?Medications - No data to display ? ?ED Course/ Medical Decision Making/ A&P ?  ?                        ?Medical Decision Making ?Amount and/or Complexity of Data Reviewed ?Radiology: ordered. ? ?Risk ?Prescription drug management. ? ?Differential diagnosis includes fracture, dislocation, tendinitis, rotator cuff injury.  X-rays ordered interpreted by me as no acute findings.  Reviewed with patient.  She is not having much pain relief with NSAIDs so we will add narcotic pain medication.  Patient counseled on side effects of this.  She already has orthopedic follow-up in the next few days.  Return instructions discussed ? ? ? ? ? ? ? ?Final Clinical Impression(s) / ED Diagnoses ?Final diagnoses:  ?Acute pain of right shoulder  ? ? ?Rx / DC Orders ?ED Discharge Orders   ? ?      Ordered  ?  oxyCODONE-acetaminophen (PERCOCET/ROXICET) 5-325 MG tablet  Every 6 hours PRN       ? 02/17/22 0905  ? ?  ?  ? ?  ? ? ?  ?Hayden Rasmussen, MD ?02/17/22 1701 ? ?

## 2022-02-17 NOTE — Discharge Instructions (Addendum)
You were seen in the emergency department for right shoulder pain.  You had an x-ray that did not show any fracture or dislocation.  There is probably some tendinitis or possible rotator cuff injury.  Continue Naprosyn.  Follow-up with orthopedics as scheduled. ?

## 2022-02-18 ENCOUNTER — Telehealth: Payer: Self-pay | Admitting: Orthopaedic Surgery

## 2022-02-18 NOTE — Telephone Encounter (Signed)
[place] forms received. To [facility] ?

## 2022-02-19 ENCOUNTER — Encounter: Payer: Self-pay | Admitting: Orthopaedic Surgery

## 2022-02-19 ENCOUNTER — Ambulatory Visit (INDEPENDENT_AMBULATORY_CARE_PROVIDER_SITE_OTHER): Payer: BC Managed Care – PPO | Admitting: Orthopaedic Surgery

## 2022-02-19 DIAGNOSIS — G5602 Carpal tunnel syndrome, left upper limb: Secondary | ICD-10-CM

## 2022-02-19 DIAGNOSIS — Z9889 Other specified postprocedural states: Secondary | ICD-10-CM | POA: Insufficient documentation

## 2022-02-19 DIAGNOSIS — M25511 Pain in right shoulder: Secondary | ICD-10-CM | POA: Diagnosis not present

## 2022-02-19 MED ORDER — LIDOCAINE HCL 2 % IJ SOLN
2.0000 mL | INTRAMUSCULAR | Status: AC | PRN
  Administered 2022-02-19: 2 mL

## 2022-02-19 MED ORDER — BUPIVACAINE HCL 0.25 % IJ SOLN
2.0000 mL | INTRAMUSCULAR | Status: AC | PRN
  Administered 2022-02-19: 2 mL via INTRA_ARTICULAR

## 2022-02-19 MED ORDER — METHYLPREDNISOLONE ACETATE 40 MG/ML IJ SUSP
40.0000 mg | INTRAMUSCULAR | Status: AC | PRN
  Administered 2022-02-19: 40 mg via INTRA_ARTICULAR

## 2022-02-19 NOTE — Progress Notes (Signed)
? ?Office Visit Note ?  ?Patient: Melissa White           ?Date of Birth: 1976-06-17           ?MRN: [id number] ?Visit Date: 02/19/2022 ?             ?Requested by: Darreld Mclean, MD ?[address] ?[place],  [place] [address] ?PCP: Darreld Mclean, MD ? ? ?Assessment & Plan: ?Visit Diagnoses:  ?1. Left carpal tunnel syndrome   ?2. Status post carpal tunnel release   ? ? ?Plan: Impression is 1 week status post left carpal tunnel syndrome and right shoulder pain likely from overuse.  In regards to the left hand, we will clean and recover the wound.  She already has a removable splint at home which she will apply.  She will wear this for the next week.  She may start range of motion activities.  No heavy lifting or submerging her hand underwater for another 3 weeks.  Follow-up next week for suture removal.  In regards to the right shoulder, I discussed starting on a steroid taper versus proceeding with injection.  She would like to try an injection for now.  Call with concerns or questions for her shoulder. ? ?Follow-Up Instructions: Return in about 1 week (around 02/26/2022).  ? ?Orders:  ?No orders of the defined types were placed in this encounter. ? ?No orders of the defined types were placed in this encounter. ? ? ? ? Procedures: ?Large Joint Inj: R subacromial bursa on 02/19/2022 11:15 AM ?Indications: pain ?Details: 22 G needle ?Medications: 2 mL lidocaine 2 %; 2 mL bupivacaine 0.25 %; 40 mg methylPREDNISolone acetate 40 MG/ML ?Outcome: tolerated well, no immediate complications ?Patient was prepped and draped in the usual sterile fashion.  ? ? ? ? ?Clinical Data: ?No additional findings. ? ? ?Subjective: ?Chief Complaint  ?Patient presents with  ? Left Hand - Routine Post Op  ? Right Shoulder - Pain  ?  Went to ED 02/17/2022- Right shoulder pain ?  ? ? ?HPI patient is a pleasant 46 year old female who comes in today 1 week status post left carpal tunnel release 02/12/2022.  She has been doing  well.  Minimal pain.  She is still complaining of some paresthesias to the index and long fingers.  Overall, doing well.  Other issue she brings up today is right shoulder pain.  This began the day after her surgery which was last Friday.  The pain she has is to the deltoid.  Pain is worse with forward flexion such as reaching to grab something out of her cabinet as well as when she internally rotates her shoulder to put on her bra.  She thinks she may have been overdoing things to compensate for the left upper extremity.  No previous injury or surgery to the right shoulder. ? ?Review of Systems as detailed in HPI.  All others reviewed and are negative. ? ? ?Objective: ?Vital Signs: There were no vitals taken for this visit. ? ?Physical Exam well-developed well-nourished female in no acute distress.  Alert and oriented x3. ? ?Ortho Exam left hand reveals a well-healing surgical incision with nylon sutures in place.  No evidence of infection or cellulitis.  Fingers warm and well-perfused.  Right shoulder shows near full forward flexion but does have pain past 160 degrees.  Pain with internal rotation for which she can only get to her back pocket.  Negative empty can test.  Negative cross  body adduction.  No focal weakness.  She is neurovascular tact distally. ? ?Specialty Comments:  ?No specialty comments available. ? ?Imaging: ?X-rays of the right shoulder reviewed by me in canopy are unremarkable ? ? ?PMFS History: ?Patient Active Problem List  ? Diagnosis Date Noted  ? Right carpal tunnel syndrome 12/23/2021  ? Left carpal tunnel syndrome 12/23/2021  ? Symptomatic anemia 03/01/2021  ? Low back pain 08/21/2019  ? Costochondritis 08/08/2019  ? HIV positive ([place]) 06/16/2017  ? Menstrual cramps 06/16/2017  ? ?Past Medical History:  ?Diagnosis Date  ? Allergy   ? Anemia   ? Fibroids   ? HIV (human immunodeficiency virus infection) ([place])   ? Right carpal tunnel syndrome 12/23/2021  ?  ?Family History  ?Problem Relation  Age of Onset  ? Diabetes Mother   ? Arthritis Mother   ? Hypertension Mother   ? Kidney disease Father   ? Hypertension Father   ?  ?Past Surgical History:  ?Procedure Laterality Date  ? CESAREAN SECTION    ? 2  ? IR RADIOLOGIST EVAL & MGMT  12/21/2018  ? UTERINE FIBROID EMBOLIZATION  03/11/2021  ? ?Social History  ? ?Occupational History  ? Not on file  ?Tobacco Use  ? Smoking status: Never  ? Smokeless tobacco: Never  ?Vaping Use  ? Vaping Use: Never used  ?Substance and Sexual Activity  ? Alcohol use: No  ? Drug use: No  ? Sexual activity: Not Currently  ?  Birth control/protection: Injection  ? ? ? ? ? ? ?

## 2022-02-26 ENCOUNTER — Ambulatory Visit (INDEPENDENT_AMBULATORY_CARE_PROVIDER_SITE_OTHER): Payer: BC Managed Care – PPO | Admitting: Physician Assistant

## 2022-02-26 ENCOUNTER — Encounter: Payer: Self-pay | Admitting: Orthopaedic Surgery

## 2022-02-26 DIAGNOSIS — Z9889 Other specified postprocedural states: Secondary | ICD-10-CM

## 2022-02-26 DIAGNOSIS — G5602 Carpal tunnel syndrome, left upper limb: Secondary | ICD-10-CM

## 2022-02-26 NOTE — Progress Notes (Signed)
? ?  Post-Op Visit Note ?  ?Patient: Melissa White           ?Date of Birth: 06/13/1976           ?MRN: [id number] ?Visit Date: 02/26/2022 ?PCP: Darreld Mclean, MD ? ? ?Assessment & Plan: ? ?Chief Complaint:  ?Chief Complaint  ?Patient presents with  ? Left Hand - Pain  ? ?Visit Diagnoses:  ?1. Left carpal tunnel syndrome   ?2. Status post carpal tunnel release   ? ? ?Plan: Patient is a pleasant 46 year old female who comes in today 2 weeks status post left carpal tunnel release 02/12/2022.  She has been doing well.  No complaints.  She does note her right shoulder pain has greatly improved following the injection last week.  Examination left wrist reveals a fully healed surgical incision with nylon sutures in place.  No evidence of infection or cellulitis.  Fingers warm well perfused.  Today, sutures were removed and Steri-Strips applied.  No heavy lifting or submerging her hand underwater for another 2 weeks.  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  Call with concerns or questions in meantime. ? ?Follow-Up Instructions: Return in about 2 weeks (around 03/12/2022).  ? ?Orders:  ?No orders of the defined types were placed in this encounter. ? ?No orders of the defined types were placed in this encounter. ? ? ?Imaging: ?No new imaging ? ?PMFS History: ?Patient Active Problem List  ? Diagnosis Date Noted  ? Status post carpal tunnel release 02/19/2022  ? Right carpal tunnel syndrome 12/23/2021  ? Left carpal tunnel syndrome 12/23/2021  ? Symptomatic anemia 03/01/2021  ? Low back pain 08/21/2019  ? Costochondritis 08/08/2019  ? HIV positive ([place]) 06/16/2017  ? Menstrual cramps 06/16/2017  ? ?Past Medical History:  ?Diagnosis Date  ? Allergy   ? Anemia   ? Fibroids   ? HIV (human immunodeficiency virus infection) ([place])   ? Right carpal tunnel syndrome 12/23/2021  ?  ?Family History  ?Problem Relation Age of Onset  ? Diabetes Mother   ? Arthritis Mother   ? Hypertension  Mother   ? Kidney disease Father   ? Hypertension Father   ?  ?Past Surgical History:  ?Procedure Laterality Date  ? CESAREAN SECTION    ? 2  ? IR RADIOLOGIST EVAL & MGMT  12/21/2018  ? UTERINE FIBROID EMBOLIZATION  03/11/2021  ? ?Social History  ? ?Occupational History  ? Not on file  ?Tobacco Use  ? Smoking status: Never  ? Smokeless tobacco: Never  ?Vaping Use  ? Vaping Use: Never used  ?Substance and Sexual Activity  ? Alcohol use: No  ? Drug use: No  ? Sexual activity: Not Currently  ?  Birth control/protection: Injection  ? ? ? ?

## 2022-03-09 ENCOUNTER — Ambulatory Visit (INDEPENDENT_AMBULATORY_CARE_PROVIDER_SITE_OTHER): Payer: BC Managed Care – PPO | Admitting: Physician Assistant

## 2022-03-09 ENCOUNTER — Encounter: Payer: Self-pay | Admitting: Physician Assistant

## 2022-03-09 DIAGNOSIS — G5602 Carpal tunnel syndrome, left upper limb: Secondary | ICD-10-CM

## 2022-03-09 NOTE — Progress Notes (Signed)
Office Visit Note   Patient: Melissa White           Date of Birth: December 09, 1975           MRN: [id number] Visit Date: 03/09/2022              Requested by: Darreld Mclean, MD [address] [place],  [place] [address] PCP: Darreld Mclean, MD  Chief Complaint  Patient presents with   Left Wrist - Routine Post Op      HPI: Patient is 4 weeks status post left carpal tunnel release with Dr. Erlinda Hong.  She feels she is continuing to improve.  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 Erlinda Hong in 1 month.  In the meantime encouraged gentle soft tissue massage over the scar with some vitamin E oil  Follow-Up Instructions: Return in about 4 weeks (around 04/06/2022).   Ortho Exam  Patient is alert, oriented, no adenopathy, well-dressed, normal affect, normal respiratory effort. Examination well-healed surgical incision she does have some delamination of the top skin layer with healthy skin skin beneath.  No erythema no redness she is able to oppose all of her fingers pulses are intact brisk capillary refill  Imaging: No results found. No images are attached to the encounter.  Labs: Lab Results  Component Value Date   HGBA1C 5.3 09/09/2020   HGBA1C 4.9 08/01/2018     Lab Results  Component Value Date   ALBUMIN 3.9 11/17/2021   ALBUMIN 3.4 (L) 03/13/2021   ALBUMIN 3.8 09/09/2020    No results found for: MG No results found for: VD25OH  No results found for: PREALBUMIN    Latest Ref Rng & Units 11/17/2021   10:01 AM 03/13/2021    7:32 AM 03/02/2021    2:23 AM  CBC EXTENDED  WBC 4.0 - 10.5 K/uL 3.7   12.4     RBC 3.87 - 5.11 Mil/uL 4.79   3.66     Hemoglobin 12.0 - 15.0 g/dL 13.1   10.1   7.9    HCT 36.0 - 46.0 % 39.4   29.9   23.4     Platelets 150.0 - 400.0 K/uL 253.0   461     NEUT# 1.7 - 7.7 K/uL  10.3     Lymph# 0.7 - 4.0 K/uL  1.1        There is no height or weight on file to calculate BMI.  Orders:  No orders of the defined types were placed in this encounter.  No orders of the defined types were placed in this encounter.    Procedures: No procedures performed  Clinical Data: No additional findings.  ROS:  All other systems negative, except as noted in the HPI. Review of Systems  Objective: Vital Signs: There were no vitals taken for this visit.  Specialty Comments:  No specialty comments available.  PMFS History: Patient Active Problem List   Diagnosis Date Noted   Status post carpal tunnel release 02/19/2022   Right carpal tunnel syndrome 12/23/2021   Left carpal tunnel syndrome 12/23/2021   Symptomatic anemia 03/01/2021   Low back pain 08/21/2019   Costochondritis 08/08/2019   HIV positive ([place]) 06/16/2017   Menstrual cramps 06/16/2017   Past Medical History:  Diagnosis Date   Allergy    Anemia    Fibroids    HIV (human immunodeficiency virus infection) ([place])    Right carpal tunnel syndrome 12/23/2021    Family History  Problem Relation Age of Onset   Diabetes Mother    Arthritis Mother    Hypertension Mother    Kidney disease Father    Hypertension Father     Past Surgical History:  Procedure Laterality Date   CESAREAN SECTION     2   IR RADIOLOGIST EVAL & MGMT  12/21/2018   UTERINE FIBROID EMBOLIZATION  03/11/2021   Social History   Occupational History   Not on file  Tobacco Use   Smoking status: Never   Smokeless tobacco: Never  Vaping Use   Vaping Use: Never used  Substance and Sexual Activity   Alcohol use: No   Drug use: No   Sexual activity: Not Currently    Birth control/protection: Injection

## 2022-03-11 DIAGNOSIS — D219 Benign neoplasm of connective and other soft tissue, unspecified: Secondary | ICD-10-CM | POA: Diagnosis not present

## 2022-03-15 DIAGNOSIS — G4733 Obstructive sleep apnea (adult) (pediatric): Secondary | ICD-10-CM | POA: Diagnosis not present

## 2022-04-07 ENCOUNTER — Ambulatory Visit (INDEPENDENT_AMBULATORY_CARE_PROVIDER_SITE_OTHER): Payer: BC Managed Care – PPO | Admitting: Orthopaedic Surgery

## 2022-04-07 ENCOUNTER — Encounter: Payer: Self-pay | Admitting: Orthopaedic Surgery

## 2022-04-07 DIAGNOSIS — Z9889 Other specified postprocedural states: Secondary | ICD-10-CM

## 2022-04-07 NOTE — Progress Notes (Signed)
   Post-Op Visit Note   Patient: Melissa White           Date of Birth: May 16, 1976           MRN: [id number] Visit Date: 04/07/2022 PCP: Darreld Mclean, MD   Assessment & Plan:  Chief Complaint:  Chief Complaint  Patient presents with   Left Wrist - Pain, Follow-up    Left carpal tunnel release 02/12/2022   Visit Diagnoses:  1. Status post carpal tunnel release     Plan: patient is 2 months s/p left carpal tunnel release on 02/12/2022.  Doing better overall in terms of pain and sleeping at night.  Has some occasional numbness in the hand and feels some tightness around the surgical scar.  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  Examination of left hand shows fully healed surgical scar.  She can make a full composite fist.  She is able to oppose the tip of the thumb to the fifth metacarpal head.  No real tenderness at the surgical scar.  Beckham is recovering well from surgery.  I think it is fine for her to return back to work without restrictions.  She is looking at getting the right carpal tunnel release done sometime next year.  She has had nerve conduction studies which show that it is severe carpal tunnel syndrome.  Follow-Up Instructions: Return if symptoms worsen or fail to improve.   Orders:  No orders of the defined types were placed in this encounter.  No orders of the defined types were placed in this encounter.   Imaging: No results found.  PMFS History: Patient Active Problem List   Diagnosis Date Noted   Status post carpal tunnel release 02/19/2022   Right carpal tunnel syndrome 12/23/2021   Left carpal tunnel syndrome 12/23/2021   Symptomatic anemia 03/01/2021   Low back pain 08/21/2019   Costochondritis 08/08/2019   HIV positive ([place] [facility]) 06/16/2017   Menstrual cramps 06/16/2017   Past Medical History:  Diagnosis Date   Allergy    Anemia    Fibroids    HIV (human immunodeficiency virus infection) ([place])    Right carpal tunnel syndrome  12/23/2021    Family History  Problem Relation Age of Onset   Diabetes Mother    Arthritis Mother    Hypertension Mother    Kidney disease Father    Hypertension Father     Past Surgical History:  Procedure Laterality Date   CESAREAN SECTION     2   IR RADIOLOGIST EVAL & MGMT  12/21/2018   UTERINE FIBROID EMBOLIZATION  03/11/2021   Social History   Occupational History   Not on file  Tobacco Use   Smoking status: Never   Smokeless tobacco: Never  Vaping Use   Vaping Use: Never used  Substance and Sexual Activity   Alcohol use: No   Drug use: No   Sexual activity: Not Currently    Birth control/protection: Injection

## 2022-04-15 ENCOUNTER — Encounter: Payer: Self-pay | Admitting: Family Medicine

## 2022-04-15 ENCOUNTER — Ambulatory Visit (INDEPENDENT_AMBULATORY_CARE_PROVIDER_SITE_OTHER): Payer: BC Managed Care – PPO | Admitting: Family Medicine

## 2022-04-15 VITALS — BP 128/85 | HR 92 | Temp 97.6°F | Ht 63.0 in | Wt 251.6 lb

## 2022-04-15 DIAGNOSIS — R109 Unspecified abdominal pain: Secondary | ICD-10-CM | POA: Diagnosis not present

## 2022-04-15 DIAGNOSIS — R319 Hematuria, unspecified: Secondary | ICD-10-CM

## 2022-04-15 DIAGNOSIS — R350 Frequency of micturition: Secondary | ICD-10-CM

## 2022-04-15 LAB — POC URINALSYSI DIPSTICK (AUTOMATED)
Bilirubin, UA: NEGATIVE
Glucose, UA: NEGATIVE
Ketones, UA: NEGATIVE
Leukocytes, UA: NEGATIVE
Nitrite, UA: NEGATIVE
Protein, UA: NEGATIVE
Spec Grav, UA: 1.015 (ref 1.010–1.025)
Urobilinogen, UA: 0.2 E.U./dL
pH, UA: 7 (ref 5.0–8.0)

## 2022-04-15 NOTE — Patient Instructions (Signed)
Ordered CT renal to check the previous stone, we may also be able to see the previous fibroid uterus as well. Since it is late in the day, expect someone to give you a call later today or tomorrow to schedule. The lab is already closed so we will hold off on labs for now.   Please contact office for follow-up if symptoms do not improve or worsen. Seek emergency care if symptoms become severe.

## 2022-04-15 NOTE — Progress Notes (Signed)
Acute Office Visit  Subjective:     Patient ID: Melissa White, female    DOB: 24-Sep-1976, 46 y.o.   MRN: [id number]  CC: lower back/abdominal pain   HPI Patient is in today for lower back pain radiating into her abdomen.  Patient states that for the past several days she has had midline lower back pain that radiates bilaterally into her lower abdomen causing a heavy/bloated feeling with aching pains.  Pains tend to be worse with any movements but she has difficulty putting on the pain scale.  States she just really does not feel quite right.  She had similar back pain back in January associated with hematuria and was noted to have a 5 mm kidney stone along with observed fibroid uterus.  Reports she had blood work procedure last year.  She has not noticed any fever, chills, body aches, vomiting, diarrhea, unusual vaginal bleeding.  She has been trying to treat with Tylenol but it has not made much difference.  She is worried that her kidney stone may be emerging for her fibroids may be worsening.   ROS All review of systems negative except what is listed in the HPI      Objective:    BP 128/85   Pulse 92   Temp 97.6 F (36.4 C)   Ht '5\' 3"'$  (1.6 m)   Wt 251 lb 9.6 oz (114.1 kg)   BMI 44.57 kg/m    Physical Exam Vitals reviewed.  Constitutional:      Appearance: Normal appearance.  Cardiovascular:     Rate and Rhythm: Normal rate and regular rhythm.  Pulmonary:     Effort: Pulmonary effort is normal.     Breath sounds: Normal breath sounds.  Abdominal:     General: Abdomen is flat. There is no distension.     Palpations: Abdomen is soft. There is no mass.     Tenderness: There is no abdominal tenderness. There is no right CVA tenderness, left CVA tenderness, guarding or rebound.     Comments: Exam impaired by body habitus  Musculoskeletal:        General: No swelling or tenderness. Normal range of motion.     Right lower leg: No edema.     Left lower leg: No  edema.  Skin:    General: Skin is warm and dry.  Neurological:     General: No focal deficit present.     Mental Status: She is alert and oriented to person, place, and time. Mental status is at baseline.  Psychiatric:        Mood and Affect: Mood normal.        Behavior: Behavior normal.        Thought Content: Thought content normal.        Judgment: Judgment normal.      Results for orders placed or performed in visit on 04/15/22  POCT Urinalysis Dipstick (Automated)  Result Value Ref Range   Color, UA yellow    Clarity, UA clear    Glucose, UA Negative Negative   Bilirubin, UA neg    Ketones, UA neg    Spec Grav, UA 1.015 1.010 - 1.025   Blood, UA small    pH, UA 7.0 5.0 - 8.0   Protein, UA Negative Negative   Urobilinogen, UA 0.2 0.2 or 1.0 E.U./dL   Nitrite, UA neg    Leukocytes, UA Negative Negative        Assessment & Plan:   1. Abdominal  pain, unspecified abdominal location 2. Urinary frequency 3. Hematuria, unspecified type UA with blood, otherwise normal.  Ordered CT renal to check the previous stone, we may also be able to see the previous fibroid uterus as well. Since it is late in the day, expect someone to give you a call later today or tomorrow to schedule. The lab is already closed so we will hold off on labs for now. Continue heating bad, ibuprofen, fluids, low back stretches.   Please contact office for follow-up if symptoms do not improve or worsen. Seek emergency care if symptoms become severe.  Patient aware of signs/symptoms requiring further/urgent evaluation.    - POCT Urinalysis Dipstick (Automated) - Urine Culture - CT RENAL STONE STUDY   No orders of the defined types were placed in this encounter.   Return if symptoms worsen or fail to improve.  Terrilyn Saver, NP

## 2022-04-15 NOTE — Progress Notes (Signed)
Abd bloating Low back pain Couple days

## 2022-04-16 LAB — URINE CULTURE
MICRO NUMBER:: 13583540
SPECIMEN QUALITY:: ADEQUATE

## 2022-04-17 ENCOUNTER — Ambulatory Visit (HOSPITAL_BASED_OUTPATIENT_CLINIC_OR_DEPARTMENT_OTHER)
Admission: RE | Admit: 2022-04-17 | Discharge: 2022-04-17 | Disposition: A | Discharge status: Home / Self Care | Payer: BC Managed Care – PPO | Source: Ambulatory Visit | Attending: Family Medicine | Admitting: Family Medicine

## 2022-04-17 DIAGNOSIS — R109 Unspecified abdominal pain: Secondary | ICD-10-CM | POA: Diagnosis not present

## 2022-04-17 DIAGNOSIS — R350 Frequency of micturition: Secondary | ICD-10-CM | POA: Diagnosis not present

## 2022-04-17 DIAGNOSIS — N200 Calculus of kidney: Secondary | ICD-10-CM | POA: Diagnosis not present

## 2022-04-17 DIAGNOSIS — R319 Hematuria, unspecified: Secondary | ICD-10-CM | POA: Insufficient documentation

## 2022-04-17 DIAGNOSIS — I878 Other specified disorders of veins: Secondary | ICD-10-CM | POA: Diagnosis not present

## 2022-04-17 DIAGNOSIS — D259 Leiomyoma of uterus, unspecified: Secondary | ICD-10-CM | POA: Diagnosis not present

## 2022-04-17 DIAGNOSIS — K760 Fatty (change of) liver, not elsewhere classified: Secondary | ICD-10-CM | POA: Diagnosis not present

## 2022-04-30 DIAGNOSIS — B3731 Acute candidiasis of vulva and vagina: Secondary | ICD-10-CM | POA: Diagnosis not present

## 2022-04-30 DIAGNOSIS — N760 Acute vaginitis: Secondary | ICD-10-CM | POA: Diagnosis not present

## 2022-04-30 DIAGNOSIS — R350 Frequency of micturition: Secondary | ICD-10-CM | POA: Diagnosis not present

## 2022-04-30 DIAGNOSIS — B9689 Other specified bacterial agents as the cause of diseases classified elsewhere: Secondary | ICD-10-CM | POA: Diagnosis not present

## 2022-04-30 DIAGNOSIS — R399 Unspecified symptoms and signs involving the genitourinary system: Secondary | ICD-10-CM | POA: Diagnosis not present

## 2022-05-04 DIAGNOSIS — Z3042 Encounter for surveillance of injectable contraceptive: Secondary | ICD-10-CM | POA: Diagnosis not present

## 2022-05-26 ENCOUNTER — Encounter: Payer: Self-pay | Admitting: Family Medicine

## 2022-05-26 ENCOUNTER — Ambulatory Visit: Payer: BC Managed Care – PPO | Admitting: Family Medicine

## 2022-05-26 VITALS — BP 129/77 | HR 87 | Ht 63.0 in | Wt 246.6 lb

## 2022-05-26 DIAGNOSIS — M546 Pain in thoracic spine: Secondary | ICD-10-CM

## 2022-05-26 MED ORDER — CYCLOBENZAPRINE HCL 5 MG PO TABS: 5.0000 mg | ORAL_TABLET | Freq: Three times a day (TID) | ORAL | 1 refills | Status: DC | PRN

## 2022-05-26 NOTE — Progress Notes (Signed)
Acute Office Visit  Subjective:     Patient ID: Melissa White, female    DOB: 02-09-1976, 46 y.o.   MRN: [id number]  CC: back pain   HPI Patient is in today for mid-back pain.    04/15/22 Patient was seen abdominal pain radiating from lower back. UA showed blood and CT scan revealed no acute findings, but previously seen punctate non-obstructing stone of right kidney. She was encouraged to follow-up with GYN for her symptoms. She saw them on 04/30/22 and ordered pelvic US to check the fibroids. Reports she is scheduled for this on Thursday. She was treated for BV and vaginal yeast infection.   Today she is reporting about 4 days of left mid back pain that is stabbing/throbbing sensation up to 8/10 at times. States she has trouble getting comfortable in the bed. Pain is worse with bending, lifting, twisting, and occasionally with deep breaths. She has not gotten relief with occasional Aleve or ice, but heating pad seems to be helping somewhat. She denies any radiation into left leg, no numbness, tingling, chest pain, dyspnea, cough, hemoptysis, palpitations, hematuria, dysuria, frequency/urgency. 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 States she feels like she may have stayed flat on her back in the bed too long last weekend and when she got up Saturday morning is when she noticed the discomfort.    ROS All review of systems negative except what is listed in the HPI      Objective:    BP 129/77   Pulse 87   Ht '5\' 3"'$  (1.6 m)   Wt 246 lb 9.6 oz (111.9 kg)   BMI 43.68 kg/m    Physical Exam Vitals reviewed.  Constitutional:      Appearance: Normal appearance.  Cardiovascular:     Rate and Rhythm: Normal rate and regular rhythm.  Pulmonary:     Effort: Pulmonary effort is normal.     Breath sounds: Normal breath sounds.  Musculoskeletal:        General: Tenderness present. No swelling or deformity. Normal range of motion.     Comments: Tenderness  to palpation of left thoracic paraspinal muscles, palpable muscle tension, no skin changes or inflammation noted  Skin:    Findings: No bruising or erythema.  Neurological:     General: No focal deficit present.     Mental Status: She is alert and oriented to person, place, and time. Mental status is at baseline.  Psychiatric:        Mood and Affect: Mood normal.        Behavior: Behavior normal.        Thought Content: Thought content normal.        Judgment: Judgment normal.     No results found for any visits on 05/26/22.      Assessment & Plan:   Problem List Items Addressed This Visit   None Visit Diagnoses     Acute left-sided thoracic back pain    -  Primary Tension/knot palpable to left thoracic paraspinal muscles which reproduces same pain patient has been describing; consider muscle spasm/strain.  - Antiinflammatory: start with using your Ibuprofen or Aleve scheduled twice daily (not both, choose one); the meloxicam we discussed may interact with your Biktarvy so we will hold off on that for now.  - Muscle relaxer as needed  - heat, ice, massage, stretches  Patient aware of signs/symptoms requiring further/urgent evaluation.     Relevant Medications  cyclobenzaprine (FLEXERIL) 5 MG tablet       Meds ordered this encounter  Medications   cyclobenzaprine (FLEXERIL) 5 MG tablet    Sig: Take 1 tablet (5 mg total) by mouth 3 (three) times daily as needed for muscle spasms.    Dispense:  30 tablet    Refill:  1    Order Specific Question:   Supervising Provider    Answer:   Penni Homans A [[id number]]    Return if symptoms worsen or fail to improve.  Terrilyn Saver, NP

## 2022-05-26 NOTE — Patient Instructions (Addendum)
Muscle spasm/strain: - Antiinflammatory: start with using your Ibuprofen or Aleve scheduled twice daily (not both, choose one); the meloxicam we discussed may interact with your Biktarvy so we will hold off on that for now.  - Muscle relaxer as needed  - heat, ice, massage, stretches    Please contact office for follow-up if symptoms do not improve or worsen. Seek emergency care if symptoms become severe.

## 2022-05-28 ENCOUNTER — Emergency Department (HOSPITAL_BASED_OUTPATIENT_CLINIC_OR_DEPARTMENT_OTHER): Payer: BC Managed Care – PPO

## 2022-05-28 ENCOUNTER — Encounter (HOSPITAL_BASED_OUTPATIENT_CLINIC_OR_DEPARTMENT_OTHER): Payer: Self-pay

## 2022-05-28 ENCOUNTER — Emergency Department (HOSPITAL_BASED_OUTPATIENT_CLINIC_OR_DEPARTMENT_OTHER)
Admission: EM | Admit: 2022-05-28 | Discharge: 2022-05-28 | Disposition: A | Discharge status: Home / Self Care | Payer: BC Managed Care – PPO | Attending: Emergency Medicine | Admitting: Emergency Medicine

## 2022-05-28 DIAGNOSIS — M546 Pain in thoracic spine: Secondary | ICD-10-CM | POA: Diagnosis not present

## 2022-05-28 DIAGNOSIS — R079 Chest pain, unspecified: Secondary | ICD-10-CM | POA: Diagnosis not present

## 2022-05-28 MED ORDER — METHOCARBAMOL 750 MG PO TABS: 750.0000 mg | ORAL_TABLET | Freq: Four times a day (QID) | ORAL | 0 refills | Status: DC

## 2022-05-28 MED ORDER — METHYLPREDNISOLONE 4 MG PO TBPK: ORAL_TABLET | ORAL | 0 refills | Status: DC

## 2022-05-28 MED ORDER — HYDROCODONE-ACETAMINOPHEN 5-325 MG PO TABS: 1.0000 | ORAL_TABLET | Freq: Four times a day (QID) | ORAL | 0 refills | Status: DC | PRN

## 2022-05-28 NOTE — ED Triage Notes (Addendum)
C/o left sided back spasms since Saturday. Denies injury, seen by PCP who stated it was musculoskeletal. Took flexeril without relief.   Pain worse when turning to left

## 2022-05-28 NOTE — Discharge Instructions (Signed)
1.  Start the Medrol Dosepak today.  Do not take Aleve, naproxen, ibuprofen or other NSAID once you start the Medrol Dosepak.  Your symptoms should improve within about 12 to 24 hours of starting the Medrol Dosepak.  You may take Robaxin as a muscle relaxer every 6 hours as needed.  If you need additional pain control take a Vicodin tablet.  Try to decrease your use of Vicodin as soon as possible due to risk of addiction and constipation.  Once pain is adequately controlled that you can discontinue the Vicodin, you may take extra strength Tylenol along with your Medrol Dosepak and Robaxin. 2.  Return to the emergency department immediately if you have weakness numbness or tingling to your arms or legs, chest pain, shortness of breath, fever, cough or other concerning changes.  Watch for any rash in the area of your pain.  Sometimes shingles starts with pain first before any rashes visible.  You start to see small blistering rash return immediately to get treatment for shingles.

## 2022-05-28 NOTE — ED Provider Notes (Signed)
[facility] DEPART[facility] Provider Note   CSN: [id number] Arrival date & time: 05/28/22  0725     History  Chief Complaint  Patient presents with   Back Pain    Melissa White is a 46 y.o. female.  HPI Patient reports acute onset of left-sided thoracic back pain 6 days ago.  No particular injury.  She reports that she might of lay in bed wrong or twisted when getting up.  She reports she has both sharp and burning pain to the left of her thoracic spine in her mid back.  Much worse with twisting motion toward the right or forward flexion.  Patient denies she had any recent cough fever or shortness of breath.  No anterior chest pain.  No lower extremity swelling or calf pain.  No history of DVT or PE.  Patient saw her PCP 3 days ago and has been trying Flexeril and anti-inflammatories with no relief.  Patient reports she has something somewhat similar several years ago and eventually it resolved spontaneously.    Home Medications Prior to Admission medications   Medication Sig Start Date End Date Taking? Authorizing Provider  HYDROcodone-acetaminophen (NORCO/VICODIN) 5-325 MG tablet Take 1-2 tablets by mouth every 6 (six) hours as needed for moderate pain or severe pain. 05/28/22  Yes Charlesetta Shanks, MD  methocarbamol (ROBAXIN-750) 750 MG tablet Take 1 tablet (750 mg total) by mouth 4 (four) times daily. 05/28/22  Yes Charlesetta Shanks, MD  methylPREDNISolone (MEDROL DOSEPAK) 4 MG TBPK tablet Per Dosepak instruction 05/28/22  Yes Ajanay Farve, Jeannie Done, MD  BIKTARVY 50-200-25 MG TABS tablet Take 1 tablet by mouth daily. 12/17/17   [provider]  cetirizine (ZYRTEC) 10 MG tablet Take 1 tablet (10 mg total) by mouth daily. 08/02/16   Gloriann Loan, PA-C  cyclobenzaprine (FLEXERIL) 5 MG tablet Take 1 tablet (5 mg total) by mouth 3 (three) times daily as needed for muscle spasms. 05/26/22   Terrilyn Saver, NP  ferrous sulfate 325 (65 FE) MG tablet Take 325 mg by mouth daily with  breakfast.    [provider]  ibuprofen (ADVIL) 600 MG tablet Take 1 tablet (600 mg total) by mouth every 6 (six) hours as needed (for pain). 03/02/21   Woodroe Mode, MD  medroxyPROGESTERone Acetate (DEPO-PROVERA IM) Inject into the muscle every 3 (three) months.    [provider]  omeprazole (PRILOSEC) 40 MG capsule Take 1 capsule (40 mg total) by mouth daily. 10/22/20   Debbrah Alar, NP  ondansetron (ZOFRAN) 4 MG tablet Take 1 tablet (4 mg total) by mouth every 8 (eight) hours as needed for nausea or vomiting. 02/09/22   Aundra Dubin, PA-C  valACYclovir (VALTREX) 500 MG tablet Take 500 mg by mouth daily. 09/13/18   [provider]      Allergies    Patient has no known allergies.    Review of Systems   Review of Systems 10 systems reviewed negative except as per HPI Physical Exam Updated Vital Signs BP 123/77 (BP Location: Right Arm)   Pulse (!) 101   Temp 98.4 F (36.9 C) (Oral)   Resp 18   Ht '5\' 3"'$  (1.6 m)   Wt 111.1 kg   SpO2 99%   BMI 43.40 kg/m  Physical Exam Constitutional:      Comments: Alert nontoxic no respiratory distress.  Uncomfortable with particular movements.  HENT:     Head: Normocephalic and atraumatic.     Mouth/Throat:     Pharynx:  Oropharynx is clear.  Eyes:     Extraocular Movements: Extraocular movements intact.  Cardiovascular:     Rate and Rhythm: Normal rate and regular rhythm.  Pulmonary:     Effort: Pulmonary effort is normal.     Breath sounds: Normal breath sounds.  Abdominal:     General: There is no distension.     Palpations: Abdomen is soft.  Musculoskeletal:     Comments: Patient does have reproducible pain in the paraspinous area on the left from about T5-T9.  No significantly reproducible midline tenderness.  This is also reproducible by forward flexion as well as twisting to the right.  No rashes or soft tissue changes.  Calves are soft and nontender.  No peripheral edema.  Skin:    General:  Skin is warm and dry.  Neurological:     General: No focal deficit present.     Mental Status: She is oriented to person, place, and time.     Motor: No weakness.     Coordination: Coordination normal.  Psychiatric:        Mood and Affect: Mood normal.     ED Results / Procedures / Treatments   Labs (all labs ordered are listed, but only abnormal results are displayed) Labs Reviewed - No data to display  EKG None  Radiology DG Thoracic Spine 2 View  Result Date: 05/28/2022 CLINICAL DATA:  Left upper back pain EXAM: THORACIC SPINE 2 VIEWS COMPARISON:  08/07/2019 FINDINGS: There is no evidence of thoracic spine fracture. Alignment is normal. Thoracic intervertebral disc heights are preserved. Degenerative disc disease is seen within the lower cervical spine. No other significant bone abnormalities are identified. IMPRESSION: 1. Negative thoracic spine radiographs. 2. Degenerative disc disease within the lower cervical spine. Electronically Signed   By: Davina Poke D.O.   On: 05/28/2022 08:46   DG Chest 2 View  Result Date: 05/28/2022 CLINICAL DATA:  Back pain EXAM: CHEST - 2 VIEW COMPARISON:  08/07/2019 FINDINGS: The heart size and mediastinal contours are within normal limits. Both lungs are clear. The visualized skeletal structures are unremarkable. IMPRESSION: No active cardiopulmonary disease. Electronically Signed   By: Davina Poke D.O.   On: 05/28/2022 08:45    Procedures Procedures    Medications Ordered in ED Medications - No data to display  ED Course/ Medical Decision Making/ A&P                           Medical Decision Making Amount and/or Complexity of Data Reviewed Radiology: ordered.  Risk Prescription drug management.   Patient has specific left-sided thoracic back pain.  No specific injury noted.  It is very reproducible and positional very suggestive of musculoskeletal pain.  Also differential includes PE or pneumonia.  Patient does not have  lower extremity pain or swelling.  She does not have shortness of breath.  No fevers chills or productive cough.  At this time these are lower probability.  Will obtain a two-view chest x-ray.  Based on clinical evaluation, if chest x-ray normal I still suspect this is most consistent with musculoskeletal pain.  I have personally looked at images of two-view chest x-ray and thoracic spine.  No appearance of pneumothorax\consolidation\mediastinal changes.  I have reviewed radiology interpretation.  No acute findings.  At this time with patient clinically well in appearance, no signs or symptoms of cardiovascular or pulmonary disease we will proceed with treatment for musculoskeletal pain.  Pain is very  reproducible and at this time I suspect muscular strain versus possible nerve impingement.  Will trial a Medrol Dosepak, Robaxin and Vicodin if needed.  I reviewed a follow-up plan with PCP and immediate return precautions for weakness numbness tingling, chest pain shortness of breath or other concerning changes.  Patient voices understanding.        Final Clinical Impression(s) / ED Diagnoses Final diagnoses:  Acute left-sided thoracic back pain    Rx / DC Orders ED Discharge Orders          Ordered    methylPREDNISolone (MEDROL DOSEPAK) 4 MG TBPK tablet        05/28/22 0952    methocarbamol (ROBAXIN-750) 750 MG tablet  4 times daily        05/28/22 0952    HYDROcodone-acetaminophen (NORCO/VICODIN) 5-325 MG tablet  Every 6 hours PRN        05/28/22 3358              Charlesetta Shanks, MD 05/28/22 [phone]

## 2022-06-05 NOTE — Progress Notes (Deleted)
[facility] at [facility] [address], [place], [place] [address] [phone]  Date:  06/08/2022   Name:  Melissa White   DOB:  1976/10/14   MRN:  [id number]  PCP:  Darreld Mclean, MD    Chief Complaint: No chief complaint on file.   History of Present Illness:  Melissa White is a 46 y.o. very pleasant female patient who presents with the following:  Pt seen today with back pain Last visit with myself 2/22- history of well controlled HIV and iron def anemia    Patient Active Problem List   Diagnosis Date Noted   Status post carpal tunnel release 02/19/2022   Right carpal tunnel syndrome 12/23/2021   Left carpal tunnel syndrome 12/23/2021   Symptomatic anemia 03/01/2021   Low back pain 08/21/2019   Costochondritis 08/08/2019   HIV positive ([place]) 06/16/2017   Menstrual cramps 06/16/2017    Past Medical History:  Diagnosis Date   Allergy    Anemia    Fibroids    HIV (human immunodeficiency virus infection) ([place])    Right carpal tunnel syndrome 12/23/2021    Past Surgical History:  Procedure Laterality Date   CESAREAN SECTION     2   IR RADIOLOGIST EVAL & MGMT  12/21/2018   UTERINE FIBROID EMBOLIZATION  03/11/2021    Social History   Tobacco Use   Smoking status: Never   Smokeless tobacco: Never  Vaping Use   Vaping Use: Never used  Substance Use Topics   Alcohol use: No   Drug use: No    Family History  Problem Relation Age of Onset   Diabetes Mother    Arthritis Mother    Hypertension Mother    Kidney disease Father    Hypertension Father     No Known Allergies  Medication list has been reviewed and updated.  Current Outpatient Medications on File Prior to Visit  Medication Sig Dispense Refill   BIKTARVY 50-200-25 MG TABS tablet Take 1 tablet by mouth daily.  5   cetirizine (ZYRTEC) 10 MG tablet Take 1 tablet (10 mg total) by mouth daily. 30 tablet 0   cyclobenzaprine (FLEXERIL) 5 MG  tablet Take 1 tablet (5 mg total) by mouth 3 (three) times daily as needed for muscle spasms. 30 tablet 1   ferrous sulfate 325 (65 FE) MG tablet Take 325 mg by mouth daily with breakfast.     HYDROcodone-acetaminophen (NORCO/VICODIN) 5-325 MG tablet Take 1-2 tablets by mouth every 6 (six) hours as needed for moderate pain or severe pain. 20 tablet 0   ibuprofen (ADVIL) 600 MG tablet Take 1 tablet (600 mg total) by mouth every 6 (six) hours as needed (for pain). 30 tablet 0   medroxyPROGESTERone Acetate (DEPO-PROVERA IM) Inject into the muscle every 3 (three) months.     methocarbamol (ROBAXIN-750) 750 MG tablet Take 1 tablet (750 mg total) by mouth 4 (four) times daily. 30 tablet 0   methylPREDNISolone (MEDROL DOSEPAK) 4 MG TBPK tablet Per Dosepak instruction 21 tablet 0   omeprazole (PRILOSEC) 40 MG capsule Take 1 capsule (40 mg total) by mouth daily. 30 capsule 3   ondansetron (ZOFRAN) 4 MG tablet Take 1 tablet (4 mg total) by mouth every 8 (eight) hours as needed for nausea or vomiting. 40 tablet 0   valACYclovir (VALTREX) 500 MG tablet Take 500 mg by mouth daily.     No current facility-administered medications on file prior to  visit.    Review of Systems:  As per HPI- otherwise negative.   Physical Examination: There were no vitals filed for this visit. There were no vitals filed for this visit. There is no height or weight on file to calculate BMI. Ideal Body Weight:    GEN: no acute distress. HEENT: Atraumatic, Normocephalic.  Ears and Nose: No external deformity. CV: RRR, No M/G/R. No JVD. No thrill. No extra heart sounds. PULM: CTA B, no wheezes, crackles, rhonchi. No retractions. No resp. distress. No accessory muscle use. ABD: S, NT, ND, +BS. No rebound. No HSM. EXTR: No c/c/e PSYCH: Normally interactive. Conversant.    Assessment and Plan: ***  Signed Lamar Blinks, MD

## 2022-06-08 ENCOUNTER — Ambulatory Visit: Payer: BC Managed Care – PPO | Admitting: Family Medicine

## 2022-06-15 DIAGNOSIS — G4733 Obstructive sleep apnea (adult) (pediatric): Secondary | ICD-10-CM | POA: Diagnosis not present

## 2022-06-24 DIAGNOSIS — D251 Intramural leiomyoma of uterus: Secondary | ICD-10-CM | POA: Diagnosis not present

## 2022-06-24 DIAGNOSIS — Z9889 Other specified postprocedural states: Secondary | ICD-10-CM | POA: Diagnosis not present

## 2022-06-24 DIAGNOSIS — Z793 Long term (current) use of hormonal contraceptives: Secondary | ICD-10-CM | POA: Diagnosis not present

## 2022-07-16 DIAGNOSIS — G4733 Obstructive sleep apnea (adult) (pediatric): Secondary | ICD-10-CM | POA: Diagnosis not present

## 2022-07-20 DIAGNOSIS — Z3042 Encounter for surveillance of injectable contraceptive: Secondary | ICD-10-CM | POA: Diagnosis not present

## 2022-08-10 DIAGNOSIS — D251 Intramural leiomyoma of uterus: Secondary | ICD-10-CM | POA: Diagnosis not present

## 2022-08-10 DIAGNOSIS — Z793 Long term (current) use of hormonal contraceptives: Secondary | ICD-10-CM | POA: Diagnosis not present

## 2022-08-10 DIAGNOSIS — Z9889 Other specified postprocedural states: Secondary | ICD-10-CM | POA: Diagnosis not present

## 2022-08-10 DIAGNOSIS — Z01411 Encounter for gynecological examination (general) (routine) with abnormal findings: Secondary | ICD-10-CM | POA: Diagnosis not present

## 2022-08-10 DIAGNOSIS — B20 Human immunodeficiency virus [HIV] disease: Secondary | ICD-10-CM | POA: Diagnosis not present

## 2022-08-14 DIAGNOSIS — Z79899 Other long term (current) drug therapy: Secondary | ICD-10-CM | POA: Diagnosis not present

## 2022-08-14 DIAGNOSIS — B20 Human immunodeficiency virus [HIV] disease: Secondary | ICD-10-CM | POA: Diagnosis not present

## 2022-08-15 DIAGNOSIS — G4733 Obstructive sleep apnea (adult) (pediatric): Secondary | ICD-10-CM | POA: Diagnosis not present

## 2022-08-20 ENCOUNTER — Telehealth: Payer: Self-pay | Admitting: Adult Health

## 2022-08-20 ENCOUNTER — Telehealth: Payer: Self-pay | Admitting: Family Medicine

## 2022-08-20 NOTE — Telephone Encounter (Signed)
Pt called stating that lately she has been waking up with headaches while she is using the Cpap machine. Pt states that she has already tried adjusting the straps and it did not work. Please advise.

## 2022-08-20 NOTE — Telephone Encounter (Signed)
Spoke with the patient. She states the headaches have been going on for awhile. Tylenol does ease them. She thinks it could be from the pressure. Her Wynonia Musty machine is a [employer]. I asked her to either take her machine and power cord by [employer] or our office, whichever is closer, so a download can be performed. We can then review the data and call her back. Pt stated she would take her machine by [employer]. I asked her to have them fax [place] the report. She verbalized appreciation for the call.

## 2022-08-20 NOTE — Telephone Encounter (Signed)
Called patient regarding her mychart appt request. BP was running high 145/92. Inquired about other symptoms via mychart. Patient complained of headache and feeling whoozy. Called patient to triage her. Blood pressure has come down some as she is checking it while at work. Patient said she thinks it could be related to her CPAP machine so they are checking on her readings at night. Triaged patient to be on the safe side.

## 2022-08-21 NOTE — Telephone Encounter (Signed)
Nurse Assessment Nurse: Lenon Curt, RN, Melanie Date/Time Eilene Ghazi Time): 08/20/2022 2:09:33 PM Confirm and document reason for call. If symptomatic, describe symptoms. ---Caller states having headaches when wakes for about a few weeks, takes Tylenol, helps, BP high 145/89, 127/75 up and down not on BP meds. AC1 6.1. CPAP machine. Does the patient have any new or worsening symptoms? ---Yes Will a triage be completed? ---Yes Related visit to physician within the last 2 weeks? ---No Does the PT have any chronic conditions? (i.e. diabetes, asthma, this includes High risk factors for pregnancy, etc.) ---Yes Is the patient pregnant or possibly pregnant? (Ask all females between the ages of 60-55) ---No Is this a behavioral health or substance abuse call? ---No Guidelines Guideline Title Affirmed Question Affirmed Notes Nurse Date/Time Eilene Ghazi Time) Sinus Pain or Congestion [1] Sinus congestion (pressure, fullness) AND [2] present > 10 days Allean Found 08/20/2022 2:11:43 PM PLEASE NOTE: All timestamps contained within this report are represented as [place] Standard Time. CONFIDENTIALTY NOTICE: This fax transmission is intended only for the addressee. It contains information that is legally privileged, confidential or otherwise protected from use or disclosure. If you are not the intended recipient, you are strictly prohibited from reviewing, disclosing, copying using or disseminating any of this information or taking any action in reliance on or regarding this information. If you have received this fax in error, please notify us immediately by telephone so that we can arrange for its return to [place]. Phone: [phone], Toll-Free: [phone], Fax: [phone] Page: 2 of 2 Call Id: [id number] [place]. Time Eilene Ghazi Time) Disposition Final User 08/20/2022 2:14:51 PM SEE PCP WITHIN 3 DAYS Yes Lenon Curt, RN, Melanie Final Disposition 08/20/2022 2:14:51 PM SEE PCP WITHIN 3 DAYS Yes Lenon Curt, RN,  Threasa Beards Caller Disagree/Comply Disagree Caller Understands Yes PreDisposition Call Doctor Care Advice Given Per Guideline SEE PCP WITHIN 3 DAYS: * You need to be seen within 2 or 3 days. * PCP VISIT: Call your doctor (or NP/PA) during regular office hours and make an appointment. A clinic or urgent care center are good places to go for care if your doctor's office is closed or you can't get an appointment. NOTE: If office will be open tomorrow, tell caller to call then, not in 3 days. NASAL WASHES FOR A STUFFY NOSE: PAIN MEDICINES: CALL BACK IF: * Difficulty breathing (and not relieved by cleaning out nose) * You become worse CARE ADVICE given per Sinus Pain or Congestion (Adult) guideline. Referrals GO TO FACILITY OTHER - SPECIFY

## 2022-08-22 NOTE — Progress Notes (Unsigned)
[facility] at [facility] [address], [place], [place] [address] [phone]  Date:  08/24/2022   Name:  Melissa White   DOB:  12/10/1975   MRN:  [id number]  PCP:  Darreld Mclean, MD    Chief Complaint: No chief complaint on file.   History of Present Illness:  Melissa White is a 46 y.o. very pleasant female patient who presents with the following:  Pt seen today for a BP and A1c recheck HIV positive   Lab Results  Component Value Date   HGBA1C 5.3 09/09/2020   Last seen by myself 2/22 History of menorrhagia and fibroids  Some labs done in January - hg normal at that time   Patient Active Problem List   Diagnosis Date Noted   Status post carpal tunnel release 02/19/2022   Right carpal tunnel syndrome 12/23/2021   Left carpal tunnel syndrome 12/23/2021   Symptomatic anemia 03/01/2021   Low back pain 08/21/2019   Costochondritis 08/08/2019   HIV positive ([place]) 06/16/2017   Menstrual cramps 06/16/2017    Past Medical History:  Diagnosis Date   Allergy    Anemia    Fibroids    HIV (human immunodeficiency virus infection) ([place])    Right carpal tunnel syndrome 12/23/2021    Past Surgical History:  Procedure Laterality Date   CESAREAN SECTION     2   IR RADIOLOGIST EVAL & MGMT  12/21/2018   UTERINE FIBROID EMBOLIZATION  03/11/2021    Social History   Tobacco Use   Smoking status: Never   Smokeless tobacco: Never  Vaping Use   Vaping Use: Never used  Substance Use Topics   Alcohol use: No   Drug use: No    Family History  Problem Relation Age of Onset   Diabetes Mother    Arthritis Mother    Hypertension Mother    Kidney disease Father    Hypertension Father     No Known Allergies  Medication list has been reviewed and updated.  Current Outpatient Medications on File Prior to Visit  Medication Sig Dispense Refill   BIKTARVY 50-200-25 MG TABS tablet Take 1 tablet by mouth daily.  5    cetirizine (ZYRTEC) 10 MG tablet Take 1 tablet (10 mg total) by mouth daily. 30 tablet 0   cyclobenzaprine (FLEXERIL) 5 MG tablet Take 1 tablet (5 mg total) by mouth 3 (three) times daily as needed for muscle spasms. 30 tablet 1   ferrous sulfate 325 (65 FE) MG tablet Take 325 mg by mouth daily with breakfast.     HYDROcodone-acetaminophen (NORCO/VICODIN) 5-325 MG tablet Take 1-2 tablets by mouth every 6 (six) hours as needed for moderate pain or severe pain. 20 tablet 0   ibuprofen (ADVIL) 600 MG tablet Take 1 tablet (600 mg total) by mouth every 6 (six) hours as needed (for pain). 30 tablet 0   medroxyPROGESTERone Acetate (DEPO-PROVERA IM) Inject into the muscle every 3 (three) months.     methocarbamol (ROBAXIN-750) 750 MG tablet Take 1 tablet (750 mg total) by mouth 4 (four) times daily. 30 tablet 0   methylPREDNISolone (MEDROL DOSEPAK) 4 MG TBPK tablet Per Dosepak instruction 21 tablet 0   omeprazole (PRILOSEC) 40 MG capsule Take 1 capsule (40 mg total) by mouth daily. 30 capsule 3   ondansetron (ZOFRAN) 4 MG tablet Take 1 tablet (4 mg total) by mouth every 8 (eight) hours as needed for nausea or vomiting. [place]  tablet 0   valACYclovir (VALTREX) 500 MG tablet Take 500 mg by mouth daily.     No current facility-administered medications on file prior to visit.    Review of Systems:  As per HPI- otherwise negative.   Physical Examination: There were no vitals filed for this visit. There were no vitals filed for this visit. There is no height or weight on file to calculate BMI. Ideal Body Weight:    GEN: no acute distress. HEENT: Atraumatic, Normocephalic.  Ears and Nose: No external deformity. CV: RRR, No M/G/R. No JVD. No thrill. No extra heart sounds. PULM: CTA B, no wheezes, crackles, rhonchi. No retractions. No resp. distress. No accessory muscle use. ABD: S, NT, ND, +BS. No rebound. No HSM. EXTR: No c/c/e PSYCH: Normally interactive. Conversant.    Assessment and  Plan: ***  Signed Lamar Blinks, MD

## 2022-08-24 ENCOUNTER — Ambulatory Visit: Payer: BC Managed Care – PPO | Admitting: Family Medicine

## 2022-08-24 VITALS — BP 122/80 | HR 84 | Temp 97.8°F | Resp 18 | Ht 63.0 in | Wt 245.0 lb

## 2022-08-24 DIAGNOSIS — J0110 Acute frontal sinusitis, unspecified: Secondary | ICD-10-CM | POA: Diagnosis not present

## 2022-08-24 DIAGNOSIS — R7303 Prediabetes: Secondary | ICD-10-CM

## 2022-08-24 MED ORDER — AMOXICILLIN 500 MG PO CAPS: 1000.0000 mg | ORAL_CAPSULE | Freq: Two times a day (BID) | ORAL | 0 refills | Status: DC

## 2022-08-24 NOTE — Patient Instructions (Signed)
We will treat you for a sinus infection with amoxicillin- let me know if not feeling improved in the next few days  For pre-diabetes; work on weight loss to the best of your ability. Let's follow-up in about 4 months and we can check your A1c

## 2022-09-22 DIAGNOSIS — G4733 Obstructive sleep apnea (adult) (pediatric): Secondary | ICD-10-CM | POA: Diagnosis not present

## 2022-09-22 DIAGNOSIS — D251 Intramural leiomyoma of uterus: Secondary | ICD-10-CM | POA: Diagnosis not present

## 2022-09-25 DIAGNOSIS — Z1231 Encounter for screening mammogram for malignant neoplasm of breast: Secondary | ICD-10-CM | POA: Diagnosis not present

## 2022-10-15 DIAGNOSIS — Z3042 Encounter for surveillance of injectable contraceptive: Secondary | ICD-10-CM | POA: Diagnosis not present

## 2022-10-15 DIAGNOSIS — Z3202 Encounter for pregnancy test, result negative: Secondary | ICD-10-CM | POA: Diagnosis not present

## 2022-10-23 DIAGNOSIS — G4733 Obstructive sleep apnea (adult) (pediatric): Secondary | ICD-10-CM | POA: Diagnosis not present

## 2022-11-06 DIAGNOSIS — H5203 Hypermetropia, bilateral: Secondary | ICD-10-CM | POA: Diagnosis not present

## 2022-11-06 DIAGNOSIS — H11153 Pinguecula, bilateral: Secondary | ICD-10-CM | POA: Diagnosis not present

## 2022-11-06 DIAGNOSIS — H524 Presbyopia: Secondary | ICD-10-CM | POA: Diagnosis not present

## 2022-11-06 DIAGNOSIS — H52221 Regular astigmatism, right eye: Secondary | ICD-10-CM | POA: Diagnosis not present

## 2022-11-06 DIAGNOSIS — H04123 Dry eye syndrome of bilateral lacrimal glands: Secondary | ICD-10-CM | POA: Diagnosis not present

## 2022-11-06 DIAGNOSIS — H40013 Open angle with borderline findings, low risk, bilateral: Secondary | ICD-10-CM | POA: Diagnosis not present

## 2022-11-06 DIAGNOSIS — B20 Human immunodeficiency virus [HIV] disease: Secondary | ICD-10-CM | POA: Diagnosis not present

## 2022-11-13 ENCOUNTER — Ambulatory Visit (INDEPENDENT_AMBULATORY_CARE_PROVIDER_SITE_OTHER): Payer: BC Managed Care – PPO | Admitting: Medical

## 2022-11-13 VITALS — BP 134/86 | HR 90 | Temp 98.0°F | Resp 18 | Ht 63.0 in | Wt 246.0 lb

## 2022-11-13 DIAGNOSIS — J069 Acute upper respiratory infection, unspecified: Secondary | ICD-10-CM

## 2022-11-13 DIAGNOSIS — R059 Cough, unspecified: Secondary | ICD-10-CM

## 2022-11-13 LAB — POCT RAPID STREP A (OFFICE): Rapid Strep A Screen: NEGATIVE

## 2022-11-13 MED ORDER — BENZONATATE 100 MG PO CAPS: 100.0000 mg | ORAL_CAPSULE | Freq: Three times a day (TID) | ORAL | 0 refills | Status: DC | PRN

## 2022-11-13 MED ORDER — FLUTICASONE PROPIONATE 50 MCG/ACT NA SUSP: 2.0000 | Freq: Every day | NASAL | 1 refills | Status: DC

## 2022-11-13 MED ORDER — AZITHROMYCIN 250 MG PO TABS: ORAL_TABLET | ORAL | 0 refills | Status: AC

## 2022-11-13 NOTE — Progress Notes (Signed)
Subjective:    Patient ID: Melissa White, female    DOB: 07-18-1976, 47 y.o.   MRN: [id number]  HPI  Pt in for nasal congestion, cough and  hoarse voice for one day. No fever, no chills and no bodyaches.   Pt had mild discomfort swallowing. She wonders if has strep based on discmofort swallowing.      Review of Systems  Constitutional:  Negative for chills, fatigue and fever.  HENT:  Positive for congestion and sore throat. Negative for ear pain and postnasal drip.   Respiratory:  Positive for cough. Negative for chest tightness, shortness of breath and wheezing.   Cardiovascular:  Negative for chest pain and palpitations.  Gastrointestinal:  Negative for abdominal pain.  Genitourinary:  Negative for dysuria and frequency.  Musculoskeletal:  Negative for back pain and joint swelling.  Neurological:  Negative for dizziness, facial asymmetry and light-headedness.  Hematological:  Negative for adenopathy. Does not bruise/bleed easily.  Psychiatric/Behavioral:  Negative for confusion.     Past Medical History:  Diagnosis Date   Allergy    Anemia    Fibroids    HIV (human immunodeficiency virus infection) ([place])    Right carpal tunnel syndrome 12/23/2021     Social History   Socioeconomic History   Marital status: Married    Spouse name: Not on file   Number of children: 3   Years of education: Not on file   Highest education level: Not on file  Occupational History   Not on file  Tobacco Use   Smoking status: Never   Smokeless tobacco: Never  Vaping Use   Vaping Use: Never used  Substance and Sexual Activity   Alcohol use: No   Drug use: No   Sexual activity: Not Currently    Birth control/protection: Injection  Other Topics Concern   Not on file  Social History Narrative   Right handed    Lives in a one story home    Has three sons    Drinks Caffeine every once in awhile    Social Determinants of Health   Financial Resource Strain: Not on file  Food  Insecurity: Not on file  Transportation Needs: Not on file  Physical Activity: Not on file  Stress: Not on file  Social Connections: Not on file  Intimate Partner Violence: Not on file    Past Surgical History:  Procedure Laterality Date   CESAREAN SECTION     2   IR RADIOLOGIST EVAL & MGMT  12/21/2018   UTERINE FIBROID EMBOLIZATION  03/11/2021    Family History  Problem Relation Age of Onset   Diabetes Mother    Arthritis Mother    Hypertension Mother    Kidney disease Father    Hypertension Father     No Known Allergies  Current Outpatient Medications on File Prior to Visit  Medication Sig Dispense Refill   BIKTARVY 50-200-25 MG TABS tablet Take 1 tablet by mouth daily.  5   cetirizine (ZYRTEC) 10 MG tablet Take 1 tablet (10 mg total) by mouth daily. 30 tablet 0   cyclobenzaprine (FLEXERIL) 5 MG tablet Take 1 tablet (5 mg total) by mouth 3 (three) times daily as needed for muscle spasms. 30 tablet 1   ferrous sulfate 325 (65 FE) MG tablet Take 325 mg by mouth daily with breakfast.     HYDROcodone-acetaminophen (NORCO/VICODIN) 5-325 MG tablet Take 1-2 tablets by mouth every 6 (six) hours as needed for moderate pain or severe pain. Oil Trough  tablet 0   ibuprofen (ADVIL) 600 MG tablet Take 1 tablet (600 mg total) by mouth every 6 (six) hours as needed (for pain). 30 tablet 0   medroxyPROGESTERone Acetate (DEPO-PROVERA IM) Inject into the muscle every 3 (three) months.     methocarbamol (ROBAXIN-750) 750 MG tablet Take 1 tablet (750 mg total) by mouth 4 (four) times daily. 30 tablet 0   methylPREDNISolone (MEDROL DOSEPAK) 4 MG TBPK tablet Per Dosepak instruction 21 tablet 0   omeprazole (PRILOSEC) 40 MG capsule Take 1 capsule (40 mg total) by mouth daily. 30 capsule 3   ondansetron (ZOFRAN) 4 MG tablet Take 1 tablet (4 mg total) by mouth every 8 (eight) hours as needed for nausea or vomiting. 40 tablet 0   valACYclovir (VALTREX) 500 MG tablet Take 500 mg by mouth daily.     No  current facility-administered medications on file prior to visit.    BP 134/86   Pulse 90   Temp 98 F (36.7 C)   Resp 18   Ht '5\' 3"'$  (1.6 m)   Wt 246 lb (111.6 kg)   SpO2 100%   BMI 43.58 kg/m        Objective:   Physical Exam   General Mental Status- Alert. General Appearance- Not in acute distress.   Heent- no sinus pressure. Normal tm. Sinus non tender. Pharynx- faint redness.  Neck Carotid Arteries- Normal color. Moisture- Normal Moisture. No carotid bruits. No JVD.  Chest and Lung Exam Auscultation: Breath Sounds:-Normal.  Cardiovascular Auscultation:Rythm- Regular. Murmurs & Other Heart Sounds:Auscultation of the heart reveals- No Murmurs.  Abdomen Inspection:-Inspeection Normal. Palpation/Percussion:Note:No mass. Palpation and Percussion of the abdomen reveal- Non Tender, Non Distended + BS, no rebound or guarding.  Neurologic Cranial Nerve exam:- CN III-XII intact(No nystagmus), symmetric smile. Strength:- 5/5 equal and symmetric strength both upper and lower extremities.   Heent- no sinus pressure. Canals clear and normal tm. No tonsil hypertrophy. No exdudate. Faint pinkish red at best posterior pharaynx.     Assessment & Plan:   Patient Instructions  Early uri with some st.  Will rx flonase for nasal congestion and benzonatate for cough.  Your rapid strep test was negative.    Approaching weekend. Will make zpack available. Advise only to use if worsen as we discussed.  Follow 7 days or sooner if needed.     Mackie Pai, PA-C

## 2022-11-13 NOTE — Patient Instructions (Addendum)
Early uri with some st.  Will rx flonase for nasal congestion and benzonatate for cough.  Your rapid strep test was negative.    Approaching weekend. Will make zpack available. Advise only to use if worsen as we discussed.  Follow 7 days or sooner if needed.

## 2022-11-23 DIAGNOSIS — G4733 Obstructive sleep apnea (adult) (pediatric): Secondary | ICD-10-CM | POA: Diagnosis not present

## 2022-12-08 ENCOUNTER — Encounter: Payer: Self-pay | Admitting: Family Medicine

## 2022-12-08 ENCOUNTER — Telehealth (INDEPENDENT_AMBULATORY_CARE_PROVIDER_SITE_OTHER): Payer: BC Managed Care – PPO | Admitting: Family Medicine

## 2022-12-08 DIAGNOSIS — U071 COVID-19: Secondary | ICD-10-CM | POA: Diagnosis not present

## 2022-12-08 MED ORDER — PROMETHAZINE-DM 6.25-15 MG/5ML PO SYRP: 5.0000 mL | ORAL_SOLUTION | Freq: Four times a day (QID) | ORAL | 0 refills | Status: DC | PRN

## 2022-12-08 MED ORDER — NIRMATRELVIR/RITONAVIR (PAXLOVID)TABLET: 3.0000 | ORAL_TABLET | Freq: Two times a day (BID) | ORAL | 0 refills | Status: AC

## 2022-12-08 NOTE — Progress Notes (Signed)
Chief Complaint  Patient presents with   Covid Positive    Melissa White here for URI complaints. Due to COVID-19 pandemic, we are interacting via web portal for an electronic face-to-face visit. I verified patient's ID using 2 identifiers. Patient agreed to proceed with visit via this method. Patient is at home, I am at office. Patient and I are present for visit.   Duration: 2 days  Associated symptoms: rhinorrhea, myalgia, diminished taste, and productive cough Denies: sinus congestion, sinus pain, itchy watery eyes, ear pain, ear drainage, sore throat, wheezing, shortness of breath, loss of smell, N/V/D and fevers Treatment to date: Mucinex DM Sick contacts: Yes; spouse has covid Tested + for covid on 12/06/22  Past Medical History:  Diagnosis Date   Allergy    Anemia    Fibroids    HIV (human immunodeficiency virus infection) ([place])    Right carpal tunnel syndrome 12/23/2021    Objective No conversational dyspnea Age appropriate judgment and insight Nml affect and mood  COVID-19 - Plan: promethazine-dextromethorphan (PROMETHAZINE-DM) 6.25-15 MG/5ML syrup, nirmatrelvir/ritonavir (PAXLOVID) 20 x 150 MG & 10 x 100MG TABS  Continue to push fluids, practice good hand hygiene, cover mouth when coughing. CDC quarantining guidelines discussed. Discussed possible drowsiness w syrup. She will only take at night as that is when her cough is the worst.  F/u prn. If starting to experience irreplaceable fluid loss, shaking, or shortness of breath, seek immediate care. Pt voiced understanding and agreement to the plan.  [place], DO 12/08/22 9:55 AM

## 2022-12-16 NOTE — Patient Instructions (Addendum)
It was good to see you again today!  Please stop by lab and then x-ray on the ground floor  I will be in touch with your results asap

## 2022-12-16 NOTE — Progress Notes (Addendum)
[facility] [address], [address] [place], [place] [phone]  Date:  12/23/2022   Name:  Melissa White   DOB:  1976/02/09   MRN:  [id number]  PCP:  Pearline Cables, MD    Chief Complaint: 4 month follow up (Concerns/ questions: lightheadedness- unsure if this is due to blood sugars or blood pressures/Hep C screen due/Rec release sent for Pap/Colon: none/)   History of Present Illness:  Melissa White is a 47 y.o. very pleasant female patient who presents with the following:  Patient seen today for periodic follow-up Most recent visit with myself was in November for a sick visit She did have covid about 2 weeks ago, she is feeling pretty much back to normal now but notes she is still coughing some  History of HIV infection, menorrhagia, uterine fibroids which have caused anemia-currently on Depo, prediabetes, sleep apnea She is not having any bleeding with depo- just occasional spotting  Infectious disease care as per [facility] GYN is also per [facility]  She notes that her belly feels more bloated- she wonders if her fibroids are larger  GYN did a pelvis only MRI for her last year - they plan to do do a hysterectomy at some point, she needs to follow-up with them to plan this  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  However, she is having more issues with small hard stools, diarrhea  Lab Results  Component Value Date   HGBA1C 5.3 09/09/2020   Hepatitis C screening Pap smear screening- per GYN Colon cancer screening- no family history, she would like to do cologuard after discussion of options COVID booster may be needed-deferred as she just had COVID Flu shot  Patient Active Problem List   Diagnosis Date Noted   Status post carpal tunnel release 02/19/2022   Right carpal tunnel syndrome 12/23/2021   Left carpal tunnel syndrome 12/23/2021   Symptomatic anemia  03/01/2021   Low back pain 08/21/2019   Costochondritis 08/08/2019   HIV positive (HCC) 06/16/2017   Menstrual cramps 06/16/2017    Past Medical History:  Diagnosis Date   Allergy    Anemia    Fibroids    HIV (human immunodeficiency virus infection) (HCC)    Right carpal tunnel syndrome 12/23/2021    Past Surgical History:  Procedure Laterality Date   CESAREAN SECTION     2   IR RADIOLOGIST EVAL & MGMT  12/21/2018   UTERINE FIBROID EMBOLIZATION  03/11/2021    Social History   Tobacco Use   Smoking status: Never   Smokeless tobacco: Never  Vaping Use   Vaping Use: Never used  Substance Use Topics   Alcohol use: No   Drug use: No    Family History  Problem Relation Age of Onset   Diabetes Mother    Arthritis Mother    Hypertension Mother    Kidney disease Father    Hypertension Father     No Known Allergies  Medication list has been reviewed and updated.  Current Outpatient Medications on File Prior to Visit  Medication Sig Dispense Refill   BIKTARVY 50-200-25 MG TABS tablet Take 1 tablet by mouth daily.  5   cetirizine (ZYRTEC) 10 MG tablet Take 1 tablet (10 mg total) by mouth daily. 30 tablet 0   ferrous sulfate 325 (65 FE) MG tablet Take 325 mg by mouth daily  with breakfast.     medroxyPROGESTERone Acetate (DEPO-PROVERA IM) Inject into the muscle every 3 (three) months.     valACYclovir (VALTREX) 500 MG tablet Take 500 mg by mouth daily.     No current facility-administered medications on file prior to visit.    Review of Systems:  As per HPI- otherwise negative.   Physical Examination: Vitals:   12/23/22 1559  BP: (!) 144/80  Pulse: 90  Resp: 18  Temp: 97.7 F (36.5 C)  SpO2: 97%   Vitals:   12/23/22 1559  Weight: 245 lb 3.2 oz (111.2 kg)  Height: 5\' 3"  (1.6 m)   Body mass index is 43.44 kg/m. Ideal Body Weight: Weight in (lb) to have BMI = 25: 140.8  GEN: no acute distress.  Obese, looks well HEENT: Atraumatic, Normocephalic.   Ears and Nose: No external deformity. CV: RRR, No M/G/R. No JVD. No thrill. No extra heart sounds. PULM: CTA B, no wheezes, crackles, rhonchi. No retractions. No resp. distress. No accessory muscle use. ABD: S, NT, ND, +BS. No rebound. No HSM.  Central obesity, belly is large but not tight, not tender EXTR: No c/c/e PSYCH: Normally interactive. Conversant.    Assessment and Plan: Colon cancer screening - Plan: Cologuard  Bloating - Plan: DG Abd Acute W/Chest, Comprehensive metabolic panel  Screening for thyroid disorder - Plan: TSH  Prediabetes - Plan: Hemoglobin A1c  Patient seen today for follow-up.  Most recent A1c was checked in October, prediabetes range.  Will follow-up today.  Will also check her thyroid Ordered Cologuard Discussed her bloating, this may be related to her fibroids although her uterus is certainly not huge.  She has complaints of gassiness, will obtain abdominal films to see how much stool she is retaining.  We can also x-ray her chest given recent COVID-19  Signed Abbe Amsterdam, MD  Addendum 3/7, received labs as below.  Message to patient  Results for orders placed or performed in visit on 12/23/22  TSH  Result Value Ref Range   TSH 2.10 0.35 - 5.50 uIU/mL  Hemoglobin A1c  Result Value Ref Range   Hgb A1c MFr Bld 6.3 4.6 - 6.5 %  Comprehensive metabolic panel  Result Value Ref Range   Sodium 139 135 - 145 mEq/L   Potassium 3.5 3.5 - 5.1 mEq/L   Chloride 106 96 - 112 mEq/L   CO2 25 19 - 32 mEq/L   Glucose, Bld 90 70 - 99 mg/dL   BUN 12 6 - 23 mg/dL   Creatinine, Ser 1.61 0.40 - 1.20 mg/dL   Total Bilirubin 0.3 0.2 - 1.2 mg/dL   Alkaline Phosphatase 70 39 - 117 U/L   AST 20 0 - 37 U/L   ALT 19 0 - 35 U/L   Total Protein 7.5 6.0 - 8.3 g/dL   Albumin 3.6 3.5 - 5.2 g/dL   GFR 09.60 >45.40 mL/min   Calcium 9.2 8.4 - 10.5 mg/dL

## 2022-12-23 ENCOUNTER — Encounter: Payer: Self-pay | Admitting: Family Medicine

## 2022-12-23 ENCOUNTER — Ambulatory Visit: Payer: BC Managed Care – PPO | Admitting: Family Medicine

## 2022-12-23 ENCOUNTER — Ambulatory Visit (HOSPITAL_BASED_OUTPATIENT_CLINIC_OR_DEPARTMENT_OTHER)
Admission: RE | Admit: 2022-12-23 | Discharge: 2022-12-23 | Disposition: A | Discharge status: Home / Self Care | Payer: BC Managed Care – PPO | Source: Ambulatory Visit | Attending: Family Medicine | Admitting: Family Medicine

## 2022-12-23 ENCOUNTER — Ambulatory Visit (INDEPENDENT_AMBULATORY_CARE_PROVIDER_SITE_OTHER): Payer: BC Managed Care – PPO | Admitting: Family Medicine

## 2022-12-23 VITALS — BP 144/80 | HR 90 | Temp 97.7°F | Resp 18 | Ht 63.0 in | Wt 245.2 lb

## 2022-12-23 DIAGNOSIS — R140 Abdominal distension (gaseous): Secondary | ICD-10-CM | POA: Diagnosis not present

## 2022-12-23 DIAGNOSIS — R7303 Prediabetes: Secondary | ICD-10-CM | POA: Diagnosis not present

## 2022-12-23 DIAGNOSIS — K5900 Constipation, unspecified: Secondary | ICD-10-CM | POA: Diagnosis not present

## 2022-12-23 DIAGNOSIS — Z1211 Encounter for screening for malignant neoplasm of colon: Secondary | ICD-10-CM

## 2022-12-23 DIAGNOSIS — Z1329 Encounter for screening for other suspected endocrine disorder: Secondary | ICD-10-CM

## 2022-12-23 DIAGNOSIS — U071 COVID-19: Secondary | ICD-10-CM | POA: Diagnosis not present

## 2022-12-23 DIAGNOSIS — R059 Cough, unspecified: Secondary | ICD-10-CM | POA: Diagnosis not present

## 2022-12-24 ENCOUNTER — Encounter: Payer: Self-pay | Admitting: Family Medicine

## 2022-12-24 LAB — COMPREHENSIVE METABOLIC PANEL
ALT: 19 U/L (ref 0–35)
AST: 20 U/L (ref 0–37)
Albumin: 3.6 g/dL (ref 3.5–5.2)
Alkaline Phosphatase: 70 U/L (ref 39–117)
BUN: 12 mg/dL (ref 6–23)
CO2: 25 mEq/L (ref 19–32)
Calcium: 9.2 mg/dL (ref 8.4–10.5)
Chloride: 106 mEq/L (ref 96–112)
Creatinine, Ser: 0.83 mg/dL (ref 0.40–1.20)
GFR: 84.62 mL/min (ref >60.00)
Glucose, Bld: 90 mg/dL (ref 70–99)
Potassium: 3.5 mEq/L (ref 3.5–5.1)
Sodium: 139 mEq/L (ref 135–145)
Total Bilirubin: 0.3 mg/dL (ref 0.2–1.2)
Total Protein: 7.5 g/dL (ref 6.0–8.3)

## 2022-12-24 LAB — HEMOGLOBIN A1C: Hgb A1c MFr Bld: 6.3 % (ref 4.6–6.5)

## 2022-12-24 LAB — TSH: TSH: 2.1 u[IU]/mL (ref 0.35–5.50)

## 2022-12-28 DIAGNOSIS — Z1211 Encounter for screening for malignant neoplasm of colon: Secondary | ICD-10-CM | POA: Diagnosis not present

## 2022-12-30 DIAGNOSIS — G4733 Obstructive sleep apnea (adult) (pediatric): Secondary | ICD-10-CM | POA: Diagnosis not present

## 2023-01-04 ENCOUNTER — Encounter: Payer: Self-pay | Admitting: Family Medicine

## 2023-01-04 LAB — COLOGUARD: COLOGUARD: NEGATIVE

## 2023-01-11 DIAGNOSIS — Z3202 Encounter for pregnancy test, result negative: Secondary | ICD-10-CM | POA: Diagnosis not present

## 2023-01-11 DIAGNOSIS — Z3042 Encounter for surveillance of injectable contraceptive: Secondary | ICD-10-CM | POA: Diagnosis not present

## 2023-01-30 DIAGNOSIS — G4733 Obstructive sleep apnea (adult) (pediatric): Secondary | ICD-10-CM | POA: Diagnosis not present

## 2023-02-01 ENCOUNTER — Encounter: Payer: Self-pay | Admitting: *Deleted

## 2023-02-16 ENCOUNTER — Telehealth: Payer: BC Managed Care – PPO | Admitting: Adult Health

## 2023-03-01 DIAGNOSIS — G4733 Obstructive sleep apnea (adult) (pediatric): Secondary | ICD-10-CM | POA: Diagnosis not present

## 2023-03-08 ENCOUNTER — Ambulatory Visit: Payer: BC Managed Care – PPO | Admitting: Neurology

## 2023-03-08 ENCOUNTER — Encounter: Payer: Self-pay | Admitting: Neurology

## 2023-03-08 ENCOUNTER — Telehealth: Payer: Self-pay | Admitting: *Deleted

## 2023-03-08 VITALS — BP 125/79 | HR 91 | Ht 63.0 in | Wt 243.0 lb

## 2023-03-08 DIAGNOSIS — G4733 Obstructive sleep apnea (adult) (pediatric): Secondary | ICD-10-CM

## 2023-03-08 NOTE — Progress Notes (Signed)
Subjective:    Patient ID: Melissa White is a 47 y.o. female.  HPI    Interim history:   Melissa White is a 47 year old right-handed female with an underlying medical history of allergies, anemia, fibroids, HIV disease, paresthesias (followed by Dr. Allena Katz at [facility] neurology), bilateral CTS, and severe obesity with a BMI of over 40, who presents for follow-up consultation of her obstructive sleep apnea, on AutoPap therapy.  The patient is unaccompanied today and presents for her yearly checkup.  She saw Everlene Other, NP in a video visit on 02/13/2022, at which time she was compliant with her AutoPap.  She was advised to follow-up routinely in 1 year.    Today, 03/08/2023: I reviewed her AutoPap compliance data from 12/26/2022 through 01/24/2023 which is a total of 30 days, during which time she used her machine every night with percent use days greater than 4 hours at 96.7%, indicating excellent compliance with an average usage of 5 hours and 57 minutes, residual AHI at goal at 0.8/h, 95th percentile of pressure at 10.5 cm with a range of 10 to 13 cm.  She reports some nasal congestion and she may wake up with a dry mouth.  She uses a nasal cushion interface, could not tolerate the full facemask.  She is motivated to continue with treatment and reports good benefit, good feedback also from her husband with regards to reduction of her snoring.  She does have some residual snoring on her back.  She is working on weight loss.  Of note, her home sleep test from 02/10/2021 showed severe obstructive sleep apnea with a total AHI of 65.6/hour and O2 nadir of 81%. Snoring was noted and appeared to be in the moderate to loud range.  She has a [employer].  The patient's allergies, current medications, family history, past medical history, past social history, past surgical history and problem list were reviewed and updated as appropriate.   Previously:   01/06/21: (She) reports snoring and morning  headaches.  Her snoring is loud per husband's feedback, she reports that he has even recorded her. She does not always wake up fully rested but tries to get enough sleep.  Her sister has sleep apnea and has a CPAP machine.  I reviewed your office note from 11/21/2020.  Her Epworth sleepiness score is 5 out of 24 today, fatigue severity score is 23 out of 63.  She lives with her husband and youngest son, 31, she has 2 older sons, ages 32 and 54.  She has woken up with a headache at times, she sometimes takes ibuprofen, headaches have improved in the past approximately 3 months.  She has nocturia about twice per average night.  Bedtime is generally between 9 and 10 PM and rise time typically around 5:30 AM.  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  She drinks caffeine in the form of soda, 1/day on average.  She is working on weight loss.  She was recently diagnosed with carpal tunnel syndrome.  She is a non-smoker and does not drink any alcohol currently.  She is followed regularly by ID.    Her Past Medical History Is Significant For: Past Medical History:  Diagnosis Date   Allergy    Anemia    Fibroids    HIV (human immunodeficiency virus infection) (HCC)    Right carpal tunnel syndrome 12/23/2021    Her Past Surgical History Is Significant For: Past Surgical History:  Procedure Laterality Date  CESAREAN SECTION     2   IR RADIOLOGIST EVAL & MGMT  12/21/2018   UTERINE FIBROID EMBOLIZATION  03/11/2021    Her Family History Is Significant For: Family History  Problem Relation Age of Onset   Diabetes Mother    Arthritis Mother    Hypertension Mother    Kidney disease Father    Hypertension Father    Sleep apnea Sister     Her Social History Is Significant For: Social History   Socioeconomic History   Marital status: Married    Spouse name: Not on file   Number of children: 3   Years of education: Not on file   Highest education level: Not on file  Occupational History    Not on file  Tobacco Use   Smoking status: Never   Smokeless tobacco: Never  Vaping Use   Vaping Use: Never used  Substance and Sexual Activity   Alcohol use: No   Drug use: No   Sexual activity: Not Currently    Birth control/protection: Injection  Other Topics Concern   Not on file  Social History Narrative   Right handed    Lives in a one story home    Has three sons    Drinks Caffeine every once in awhile    Social Determinants of Health   Financial Resource Strain: Not on file  Food Insecurity: Not on file  Transportation Needs: Not on file  Physical Activity: Not on file  Stress: Not on file  Social Connections: Not on file    Her Allergies Are:  No Known Allergies:   Her Current Medications Are:  Outpatient Encounter Medications as of 03/08/2023  Medication Sig   BIKTARVY 50-200-25 MG TABS tablet Take 1 tablet by mouth daily.   cetirizine (ZYRTEC) 10 MG tablet Take 1 tablet (10 mg total) by mouth daily.   ferrous sulfate 325 (65 FE) MG tablet Take 325 mg by mouth daily with breakfast.   medroxyPROGESTERone Acetate (DEPO-PROVERA IM) Inject into the muscle every 3 (three) months.   valACYclovir (VALTREX) 500 MG tablet Take 500 mg by mouth daily.   No facility-administered encounter medications on file as of 03/08/2023.  :  Review of Systems:  Out of a complete 14 point review of systems, all are reviewed and negative with the exception of these symptoms as listed below:  Review of Systems  Neurological:        Pt here for CPAP f/u Pt states too much air pressure Pt states wakes up in am congested and dry mouth    ESS:3    Objective:  Neurological Exam  Physical Exam Physical Examination:   Vitals:   03/08/23 1232  BP: 125/79  Pulse: 91    General Examination: The patient is a very pleasant 47 y.o. female in no acute distress. She appears well-developed and well-nourished and well groomed.   HEENT: Normocephalic, atraumatic, pupils are equal,  round and reactive to light, extraocular tracking is good without limitation to gaze excursion or nystagmus noted. Hearing is grossly intact. Face is symmetric with normal facial animation. Speech is clear with no dysarthria noted. There is no hypophonia. There is no lip, neck/head, jaw or voice tremor. Neck is supple with full range of passive and active motion. There are no carotid bruits on auscultation. Oropharynx exam reveals: mild mouth dryness, good dental hygiene and moderate airway crowding, due to small airway.  Tonsils are about 1+ bilaterally.  Mallampati class III.  Neck circumference of  17-5/8 inches.  She has a moderate overbite.  Tongue protrudes centrally and palate elevates symmetrically.   Chest: Clear to auscultation without wheezing, rhonchi or crackles noted.   Heart: S1+S2+0, regular and normal without murmurs, rubs or gallops noted.    Abdomen: Soft, non-tender and non-distended with normal bowel sounds appreciated on auscultation.   Extremities: There is no pitting edema in the distal lower extremities bilaterally.    Skin: Warm and dry without trophic changes noted.    Musculoskeletal: exam reveals no obvious joint deformities, tenderness or joint swelling or erythema.    Neurologically:  Mental status: The patient is awake, alert and oriented in all 4 spheres. Her immediate and remote memory, attention, language skills and fund of knowledge are appropriate. There is no evidence of aphasia, agnosia, apraxia or anomia. Speech is clear with normal prosody and enunciation. Thought process is linear. Mood is normal and affect is normal.  Cranial nerves II - XII are as described above under HEENT exam.  Motor exam: Normal bulk, strength and tone is noted. There is no tremor, Romberg is negative. Fine motor skills and coordination: grossly intact.  Cerebellar testing: No dysmetria or intention tremor. There is no truncal or gait ataxia.  Sensory exam: intact to light touch in  the upper and lower extremities.  Gait, station and balance: She stands easily. No veering to one side is noted. No leaning to one side is noted. Posture is age-appropriate and stance is narrow based. Gait shows normal stride length and normal pace. No problems turning are noted. Tandem walk is unremarkable.                 Assessment and Plan:    In summary, Melissa White is a 47 year old right-handed female with an underlying medical history of allergies, anemia, fibroids, HIV disease, paresthesias (followed by Dr. Allena Katz at [facility] neurology), bilateral CTS, and severe obesity with a BMI of over 40, who presents for follow-up consultation of her obstructive sleep apnea, on AutoPap therapy.  She has been compliant with treatment.  We mutually agreed to try to lower her pressure settings, currently 10 to 13 cm to 811 cm on her AutoPap.  She is commended for treatment adherence.  She is encouraged to continue to work on weight loss. Of note, her home sleep test from 02/10/2021 showed severe obstructive sleep apnea with a total AHI of 65.6/hour and O2 nadir of 81%. Snoring was in the moderate to loud range.  She has a [employer]. Her DME company is [employer].  She is advised to continue to use her AutoPap machine consistently.  She is advised to follow-up routinely in this clinic to see one of our nurse practitioners in 1 year.  We can offer her a MyChart video visit if she prefers.  I answered all her questions today and she was in agreement. I spent 30 minutes in total face-to-face time and in reviewing records during pre-charting, more than 50% of which was spent in counseling and coordination of care, reviewing test results, reviewing medications and treatment regimen and/or in discussing or reviewing the diagnosis of OSA, the prognosis and treatment options. Pertinent laboratory and imaging test results that were available during this visit with the patient were reviewed by me and  considered in my medical decision making (see chart for details).

## 2023-03-08 NOTE — Patient Instructions (Signed)
Please continue using your autoPAP regularly. While your insurance requires that you use PAP at least 4 hours each night on 70% of the nights, I recommend, that you not skip any nights and use it throughout the night if you can. Getting used to PAP and staying with the treatment long term does take time and patience and discipline. Untreated obstructive sleep apnea when it is moderate to severe can have an adverse impact on cardiovascular health and raise her risk for heart disease, arrhythmias, hypertension, congestive heart failure, stroke and diabetes. Untreated obstructive sleep apnea causes sleep disruption, nonrestorative sleep, and sleep deprivation. This can have an impact on your day to day functioning and cause daytime sleepiness and impairment of cognitive function, memory loss, mood disturbance, and problems focussing. Using PAP regularly can improve these symptoms.  As discussed, we will lower your pressure setting from currently 10 to 13 cm to 8 to 11 cm for better tolerance and less mouth dryness.  You can change her humidity setting to your liking as well.

## 2023-03-08 NOTE — Telephone Encounter (Signed)
Order for change in autopap pressure sent to [employer].   Min pressure 8 cm, max 11 cm

## 2023-03-09 NOTE — Telephone Encounter (Signed)
[facility] confirmed receipt of order.  

## 2023-04-02 DIAGNOSIS — Z3042 Encounter for surveillance of injectable contraceptive: Secondary | ICD-10-CM | POA: Diagnosis not present

## 2023-05-07 DIAGNOSIS — Z6841 Body Mass Index (BMI) 40.0 and over, adult: Secondary | ICD-10-CM | POA: Diagnosis not present

## 2023-05-07 DIAGNOSIS — E669 Obesity, unspecified: Secondary | ICD-10-CM | POA: Diagnosis not present

## 2023-05-07 DIAGNOSIS — Z9889 Other specified postprocedural states: Secondary | ICD-10-CM | POA: Diagnosis not present

## 2023-05-07 DIAGNOSIS — D251 Intramural leiomyoma of uterus: Secondary | ICD-10-CM | POA: Diagnosis not present

## 2023-05-09 DIAGNOSIS — G4733 Obstructive sleep apnea (adult) (pediatric): Secondary | ICD-10-CM | POA: Diagnosis not present

## 2023-05-17 ENCOUNTER — Telehealth: Payer: Self-pay | Admitting: Adult Health

## 2023-05-17 DIAGNOSIS — B20 Human immunodeficiency virus [HIV] disease: Secondary | ICD-10-CM | POA: Diagnosis not present

## 2023-05-17 DIAGNOSIS — Z13220 Encounter for screening for lipoid disorders: Secondary | ICD-10-CM | POA: Diagnosis not present

## 2023-05-17 NOTE — Telephone Encounter (Signed)
Per [facility], settings were not changed. Sent urgent rq to DME [facility]

## 2023-05-17 NOTE — Telephone Encounter (Signed)
From when the request was made back in May for: Order for change in autopap pressure sent to [employer].    Min pressure 8 cm, max 11 cm    Pt has never been contacted re: this being done, she has still been waiting.  Pt is asking the order be sent again to DME.  Message sent as high priority since pt has been waiting since May 20th

## 2023-05-19 NOTE — Telephone Encounter (Signed)
DME attempted to contact pt ck in May and wasn't able to reach her.

## 2023-06-20 DIAGNOSIS — G4733 Obstructive sleep apnea (adult) (pediatric): Secondary | ICD-10-CM | POA: Diagnosis not present

## 2023-06-25 DIAGNOSIS — Z3202 Encounter for pregnancy test, result negative: Secondary | ICD-10-CM | POA: Diagnosis not present

## 2023-06-25 DIAGNOSIS — Z3042 Encounter for surveillance of injectable contraceptive: Secondary | ICD-10-CM | POA: Diagnosis not present

## 2023-07-02 DIAGNOSIS — N711 Chronic inflammatory disease of uterus: Secondary | ICD-10-CM | POA: Diagnosis not present

## 2023-07-02 DIAGNOSIS — D259 Leiomyoma of uterus, unspecified: Secondary | ICD-10-CM | POA: Diagnosis not present

## 2023-07-02 DIAGNOSIS — Z124 Encounter for screening for malignant neoplasm of cervix: Secondary | ICD-10-CM | POA: Diagnosis not present

## 2023-07-02 DIAGNOSIS — Z3202 Encounter for pregnancy test, result negative: Secondary | ICD-10-CM | POA: Diagnosis not present

## 2023-07-02 DIAGNOSIS — R9341 Abnormal radiologic findings on diagnostic imaging of renal pelvis, ureter, or bladder: Secondary | ICD-10-CM | POA: Diagnosis not present

## 2023-07-02 DIAGNOSIS — D251 Intramural leiomyoma of uterus: Secondary | ICD-10-CM | POA: Diagnosis not present

## 2023-07-17 IMAGING — CT CT RENAL STONE PROTOCOL
2 of 4 series · 16 of 46 positions shown, 18 images · non-contrast
Comparison: Chest CT dated March 13, 2021

CLINICAL DATA: Right flank pain



[Series 2: axial st · axial · 0.98mm/px · z∈[+822,+1242]mm · 13 of 92 slices shown, 15 images]
[im 4/92  soft-tissue]
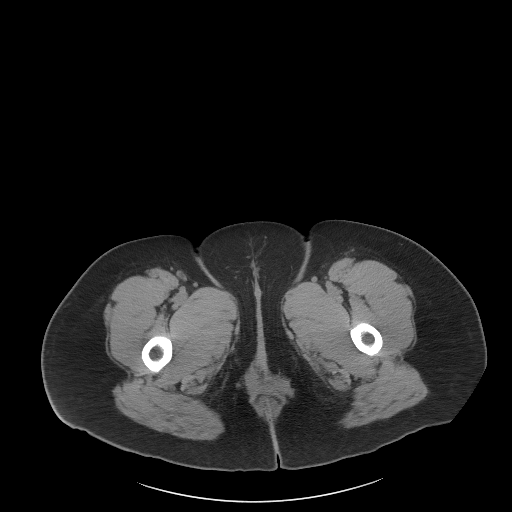
[im 4/92  bone]
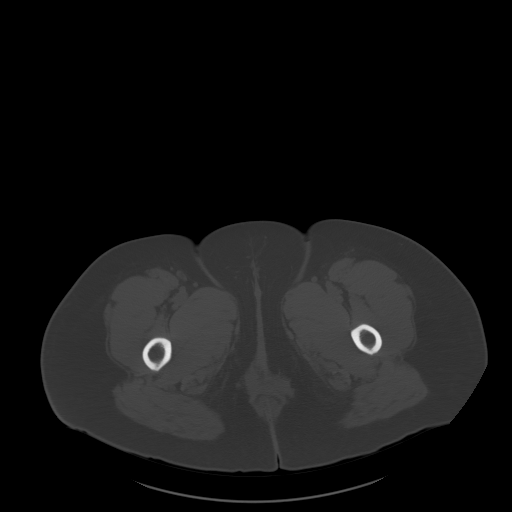
[im 12/92  soft-tissue]
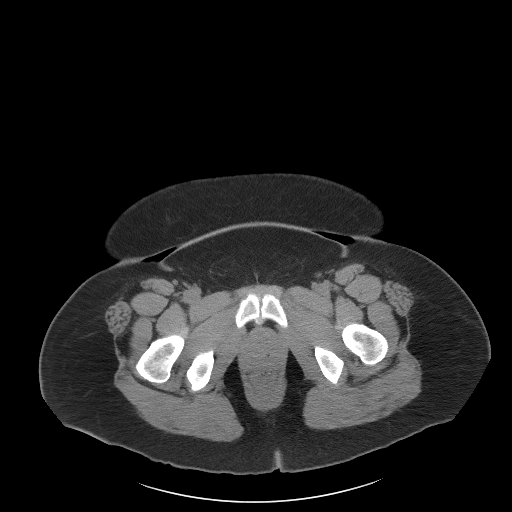
[im 20/92  soft-tissue]
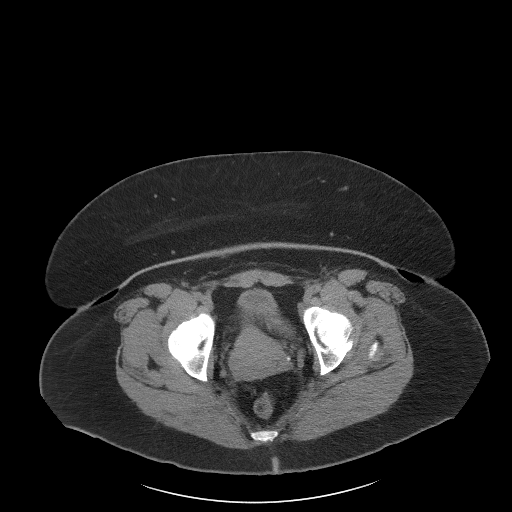
[im 24/92  soft-tissue]
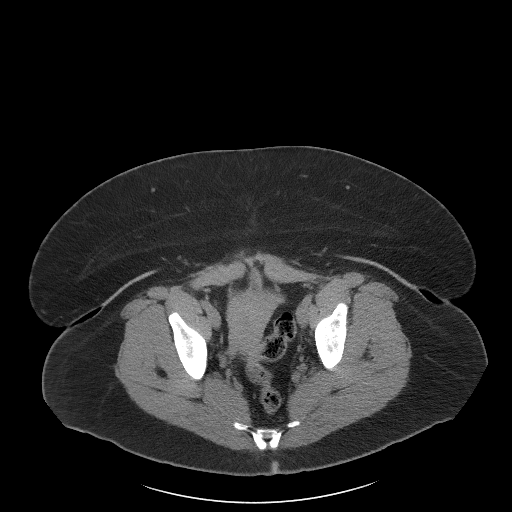
[im 32/92  soft-tissue]
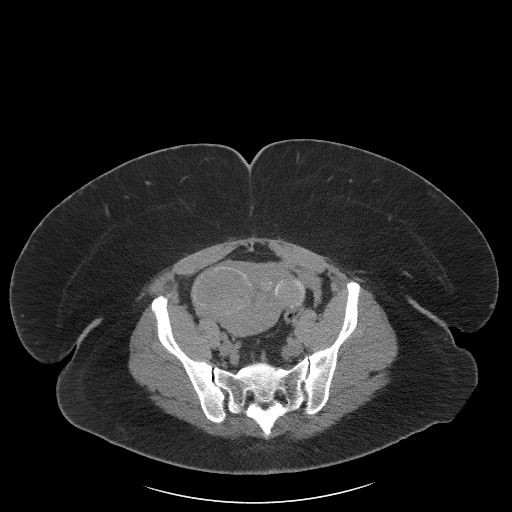
[im 40/92  soft-tissue]
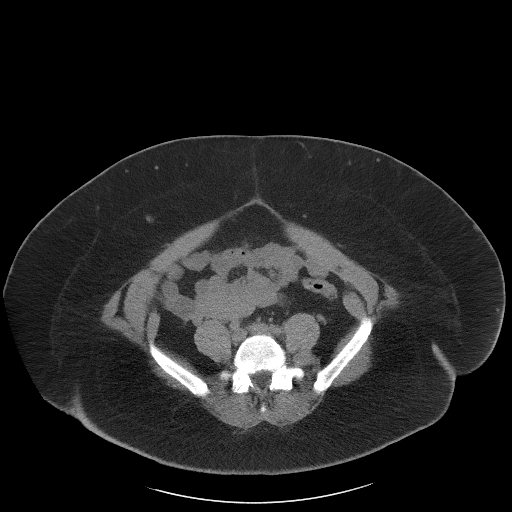
[im 48/92  soft-tissue]
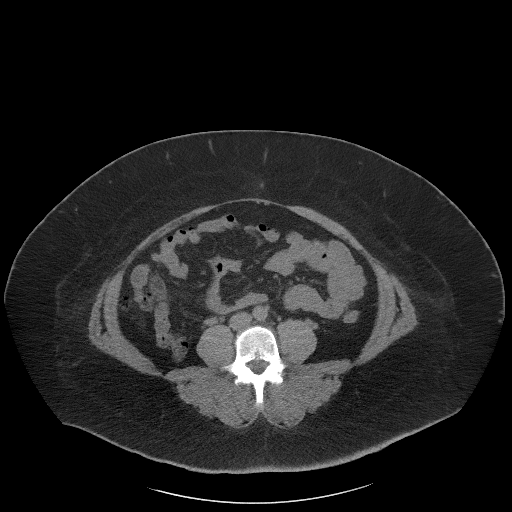
[im 52/92  soft-tissue]
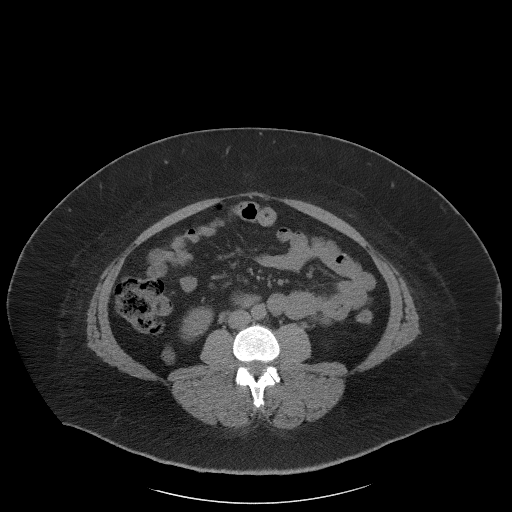
[im 60/92  soft-tissue]
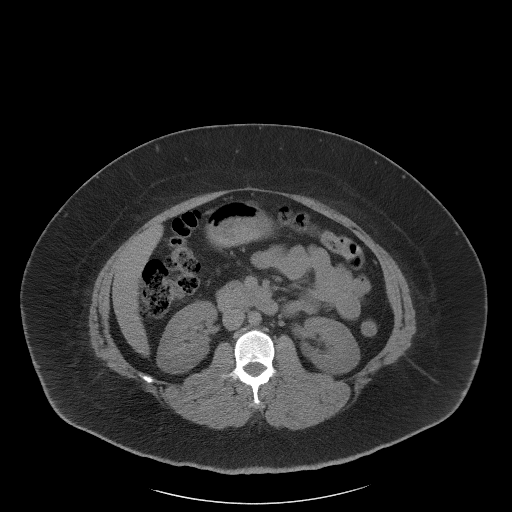
[im 60/92  bone]
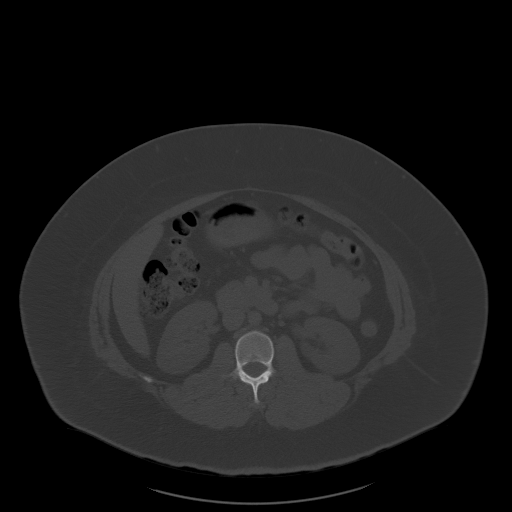
[im 68/92  soft-tissue]
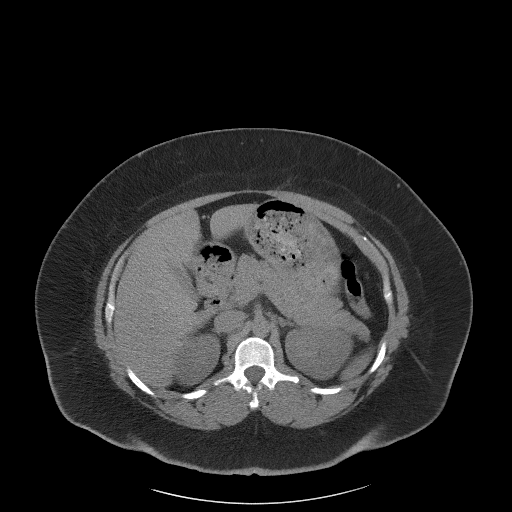
[im 72/92  soft-tissue]
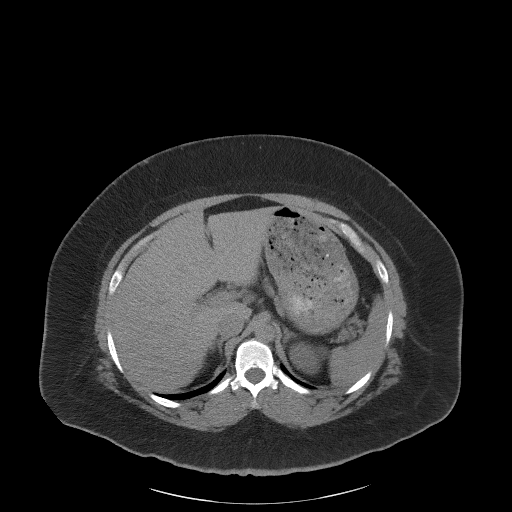
[im 80/92  soft-tissue]
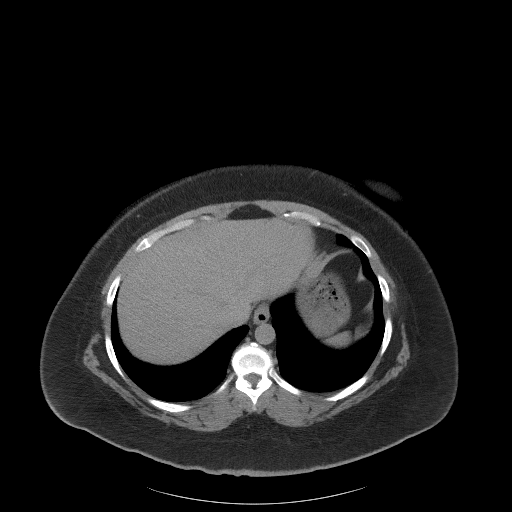
[im 88/92  soft-tissue]
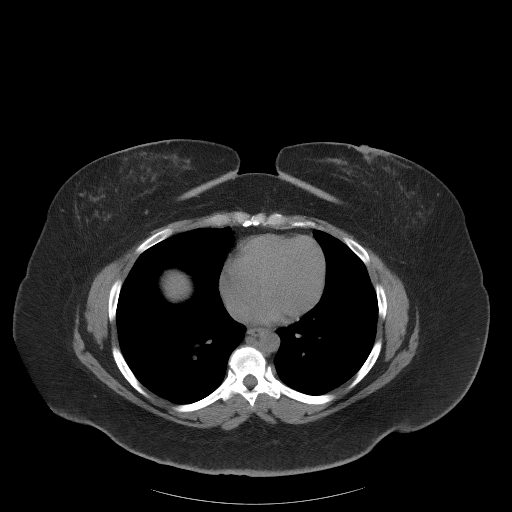

[Series 5: coronal st · coronal · 1.04mm/px · 3 of 127 slices shown]
[im 43/127  soft-tissue]
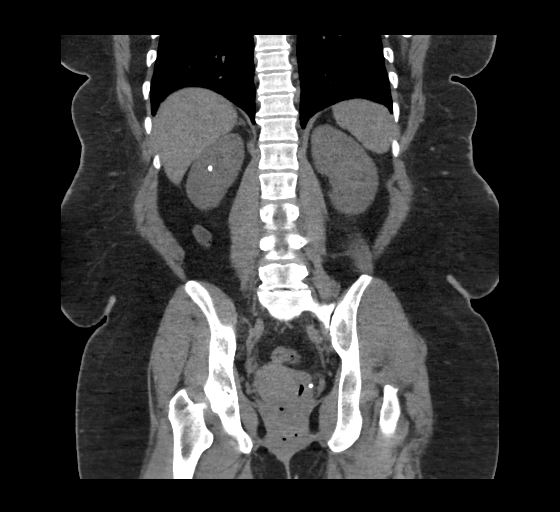
[im 57/127  soft-tissue]
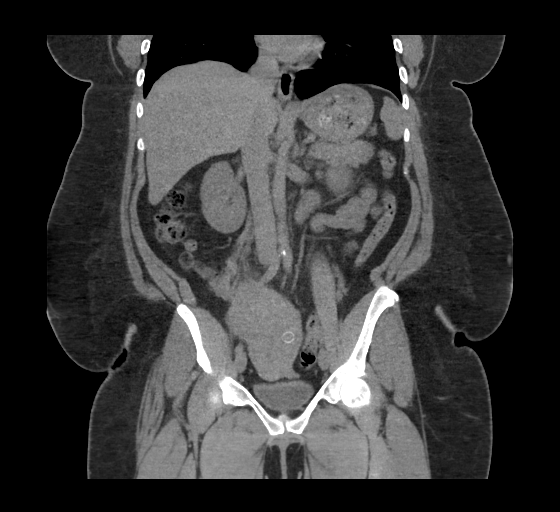
[im 71/127  soft-tissue]
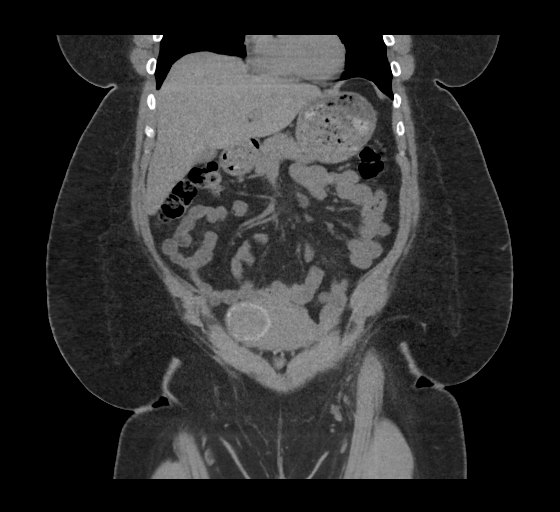

[16 of 46 positions shown; findings below may reference images not displayed]

FINDINGS: Lower chest: No acute abnormality.

Hepatobiliary: No focal liver abnormality is seen. No gallstones,
gallbladder wall thickening, or biliary dilatation.

Pancreas: Unremarkable. No pancreatic ductal dilatation or
surrounding inflammatory changes.

Spleen: Normal in size without focal abnormality.

Adrenals/Urinary Tract: No hydronephrosis. Nonobstructing stone of
the upper pole of the right kidney is slightly decreased in size
when compared with prior exam measuring 5 mm, previously measured up
to 8 mm. Bladder is unremarkable.

Stomach/Bowel: Stomach is within normal limits. Appendix appears
normal. No evidence of bowel wall thickening, distention, or
inflammatory changes.

Vascular/Lymphatic: Minimal iliac calcifications. No abdominal
aortic calcifications. No pathologically enlarged lymph nodes seen
in the chest.

Reproductive: Multiple fibroids seen in the uterus, some are
calcified, uterus is decreased in size when compared with prior
exam. No adnexal masses.

Other: No abdominal wall hernia or abnormality. No abdominopelvic
ascites.

Musculoskeletal: No acute or significant osseous findings.
IMPRESSION: 1. No acute findings in the abdomen or pelvis, including no evidence
of obstructive uropathy.
2. Right nephrolithiasis.
3. Fibroid uterus which is decreased in size when compared with
prior exam, compatible with prior fibroid embolization.

## 2023-08-09 DIAGNOSIS — D251 Intramural leiomyoma of uterus: Secondary | ICD-10-CM | POA: Diagnosis not present

## 2023-08-09 DIAGNOSIS — G4733 Obstructive sleep apnea (adult) (pediatric): Secondary | ICD-10-CM | POA: Diagnosis not present

## 2023-08-09 DIAGNOSIS — Q644 Malformation of urachus: Secondary | ICD-10-CM | POA: Diagnosis not present

## 2023-08-20 DIAGNOSIS — N329 Bladder disorder, unspecified: Secondary | ICD-10-CM | POA: Diagnosis not present

## 2023-08-20 DIAGNOSIS — Q644 Malformation of urachus: Secondary | ICD-10-CM | POA: Diagnosis not present

## 2023-08-20 DIAGNOSIS — D259 Leiomyoma of uterus, unspecified: Secondary | ICD-10-CM | POA: Diagnosis not present

## 2023-09-15 DIAGNOSIS — Z3042 Encounter for surveillance of injectable contraceptive: Secondary | ICD-10-CM | POA: Diagnosis not present

## 2023-09-19 HISTORY — PX: HYSTEROTOMY: SHX1776

## 2023-09-21 DIAGNOSIS — D251 Intramural leiomyoma of uterus: Secondary | ICD-10-CM | POA: Diagnosis not present

## 2023-09-21 DIAGNOSIS — Z01818 Encounter for other preprocedural examination: Secondary | ICD-10-CM | POA: Diagnosis not present

## 2023-09-21 DIAGNOSIS — B20 Human immunodeficiency virus [HIV] disease: Secondary | ICD-10-CM | POA: Diagnosis not present

## 2023-09-27 DIAGNOSIS — Z6841 Body Mass Index (BMI) 40.0 and over, adult: Secondary | ICD-10-CM | POA: Diagnosis not present

## 2023-09-27 DIAGNOSIS — N72 Inflammatory disease of cervix uteri: Secondary | ICD-10-CM | POA: Diagnosis not present

## 2023-09-27 DIAGNOSIS — E669 Obesity, unspecified: Secondary | ICD-10-CM | POA: Diagnosis not present

## 2023-09-27 DIAGNOSIS — D251 Intramural leiomyoma of uterus: Secondary | ICD-10-CM | POA: Diagnosis not present

## 2023-09-27 DIAGNOSIS — Z98891 History of uterine scar from previous surgery: Secondary | ICD-10-CM | POA: Diagnosis not present

## 2023-09-27 DIAGNOSIS — D259 Leiomyoma of uterus, unspecified: Secondary | ICD-10-CM | POA: Diagnosis not present

## 2023-09-27 DIAGNOSIS — M311 Thrombotic microangiopathy, unspecified: Secondary | ICD-10-CM | POA: Diagnosis not present

## 2023-09-27 DIAGNOSIS — K660 Peritoneal adhesions (postprocedural) (postinfection): Secondary | ICD-10-CM | POA: Diagnosis not present

## 2023-10-17 DIAGNOSIS — G4733 Obstructive sleep apnea (adult) (pediatric): Secondary | ICD-10-CM | POA: Diagnosis not present

## 2023-10-18 DIAGNOSIS — Z90710 Acquired absence of both cervix and uterus: Secondary | ICD-10-CM | POA: Diagnosis not present

## 2023-10-18 DIAGNOSIS — D251 Intramural leiomyoma of uterus: Secondary | ICD-10-CM | POA: Diagnosis not present

## 2023-11-12 DIAGNOSIS — D251 Intramural leiomyoma of uterus: Secondary | ICD-10-CM | POA: Diagnosis not present

## 2023-11-12 DIAGNOSIS — Z4889 Encounter for other specified surgical aftercare: Secondary | ICD-10-CM | POA: Diagnosis not present

## 2023-11-12 DIAGNOSIS — H52221 Regular astigmatism, right eye: Secondary | ICD-10-CM | POA: Diagnosis not present

## 2023-11-12 DIAGNOSIS — B20 Human immunodeficiency virus [HIV] disease: Secondary | ICD-10-CM | POA: Diagnosis not present

## 2023-11-12 DIAGNOSIS — H5203 Hypermetropia, bilateral: Secondary | ICD-10-CM | POA: Diagnosis not present

## 2023-11-12 DIAGNOSIS — D500 Iron deficiency anemia secondary to blood loss (chronic): Secondary | ICD-10-CM | POA: Diagnosis not present

## 2023-11-12 DIAGNOSIS — H524 Presbyopia: Secondary | ICD-10-CM | POA: Diagnosis not present

## 2023-11-12 DIAGNOSIS — H04123 Dry eye syndrome of bilateral lacrimal glands: Secondary | ICD-10-CM | POA: Diagnosis not present

## 2023-11-12 DIAGNOSIS — Z9889 Other specified postprocedural states: Secondary | ICD-10-CM | POA: Diagnosis not present

## 2023-11-12 DIAGNOSIS — Z90710 Acquired absence of both cervix and uterus: Secondary | ICD-10-CM | POA: Diagnosis not present

## 2023-11-12 DIAGNOSIS — H40013 Open angle with borderline findings, low risk, bilateral: Secondary | ICD-10-CM | POA: Diagnosis not present

## 2024-02-29 NOTE — Progress Notes (Addendum)
 [facility] [address] [place], [place] [address] [phone]  Date:  03/02/2024   Name:  Melissa White   DOB:  01/31/1976   MRN:  [id number]  PCP:  Kaylee Partridge, MD    Chief Complaint: Follow-up   History of Present Illness:  Melissa White is a 48 y.o. very pleasant female patient who presents with the following:  Patient seen today for follow-up of recent labs Last seen by myself just over 1 year ago  History of HIV infection, menorrhagia, uterine fibroids which have caused anemia-currently on Depo, prediabetes, sleep apnea She is not having any bleeding with depo- just occasional spotting  Infectious disease care as per [facility] GYN is also per [facility] Mammo was done at [facility] - 12/23  Negative Cologuard last year She was dealing with enlarged fibroids last year, she did have a hysterectomy in December 2024-was seen by GYN for follow-up at the end of January She is getting adjusted from her surgery- she still has her ovaries.  No longer using Depo She was relieved to no longer have menstrual bleeding which previously had caused significant anemia  She was seen by ID this past March Currently maintained on Biktarvy  and reports no missed doses and continues to tolerate it well.   Labs on chart from [facility], March 10 of this year-CMP, CBC, HIV Patient Active Problem List   Diagnosis Date Noted   Status post carpal tunnel release 02/19/2022   Right carpal tunnel syndrome 12/23/2021   Left carpal tunnel syndrome 12/23/2021   Symptomatic anemia 03/01/2021   Low back pain 08/21/2019   Costochondritis 08/08/2019   HIV positive (HCC) 06/16/2017   Menstrual cramps 06/16/2017    Past Medical History:  Diagnosis Date   Allergy    Anemia    Fibroids    HIV (human immunodeficiency virus infection) (HCC)    Right carpal tunnel syndrome 12/23/2021    Past Surgical  History:  Procedure Laterality Date   CESAREAN SECTION     2   IR RADIOLOGIST EVAL & MGMT  12/21/2018   UTERINE FIBROID EMBOLIZATION  03/11/2021    Social History   Tobacco Use   Smoking status: Never   Smokeless tobacco: Never  Vaping Use   Vaping status: Never Used  Substance Use Topics   Alcohol use: No   Drug use: No    Family History  Problem Relation Age of Onset   Diabetes Mother    Arthritis Mother    Hypertension Mother    Kidney disease Father    Hypertension Father    Sleep apnea Sister     No Known Allergies  Medication list has been reviewed and updated.  Current Outpatient Medications on File Prior to Visit  Medication Sig Dispense Refill   BIKTARVY  50-200-25 MG TABS tablet Take 1 tablet by mouth daily.  5   cetirizine  (ZYRTEC ) 10 MG tablet Take 1 tablet (10 mg total) by mouth daily. 30 tablet 0   ferrous sulfate 325 (65 FE) MG tablet Take 325 mg by mouth daily with breakfast.     medroxyPROGESTERone  Acetate (DEPO-PROVERA  IM) Inject into the muscle every 3 (three) months. (Patient not taking: Reported on 03/02/2024)     valACYclovir  (VALTREX ) 500 MG tablet Take 500 mg by mouth daily.     No current facility-administered medications on file prior to visit.    Review of Systems:  As per HPI- otherwise negative.  BP Readings from Last 3 Encounters:  03/02/24 (!) 144/76  03/08/23 125/79  12/23/22 (!) 144/80     Physical Examination: Vitals:   03/02/24 1525 03/02/24 1547  BP: (!) 150/78 (!) 144/76  Pulse: 98   Resp: 18   Temp: 98 F (36.7 C)   SpO2: 97%    Vitals:   03/02/24 1525  Weight: 253 lb 3.2 oz (114.9 kg)  Height: 5\' 3"  (1.6 m)   Body mass index is 44.85 kg/m. Ideal Body Weight: Weight in (lb) to have BMI = 25: 140.8  GEN: no acute distress.  Obese, looks well HEENT: Atraumatic, Normocephalic.  Ears and Nose: No external deformity. CV: RRR, No M/G/R. No JVD. No thrill. No extra heart sounds. PULM: CTA B, no wheezes,  crackles, rhonchi. No retractions. No resp. distress. No accessory muscle use. ABD: S, NT, ND EXTR: No c/c/e PSYCH: Normally interactive. Conversant.    Assessment and Plan: Screening for thyroid  disorder - Plan: TSH  Prediabetes - Plan: Hemoglobin A1c  Screening, lipid - Plan: Lipid panel  Iron  deficiency - Plan: Ferritin  Patient seen today for follow-up.  She has some of her lab work done at other practices, but I will check her thyroid , A1c, cholesterol and iron  today  Encouraged her to set up her mammogram  Her blood pressure is mildly elevated-typically has been under reasonable control.  Will continue to monitor for now  BP Readings from Last 3 Encounters:  03/02/24 (!) 144/76  03/08/23 125/79  12/23/22 (!) 144/80     Signed Gates Kasal, MD Received labs 5/21- message to pt  Results for orders placed or performed in visit on 03/02/24  Hemoglobin A1c   Collection Time: 03/02/24  3:48 PM  Result Value Ref Range   Hgb A1c MFr Bld 6.1 4.6 - 6.5 %  Lipid panel   Collection Time: 03/02/24  3:48 PM  Result Value Ref Range   Cholesterol 208 (H) 0 - 200 mg/dL   Triglycerides 161.0 (H) 0.0 - 149.0 mg/dL   HDL 96.04 >54.09 mg/dL   VLDL 81.1 0.0 - 91.4 mg/dL   LDL Cholesterol 782 (H) 0 - 99 mg/dL   Total CHOL/HDL Ratio 5    NonHDL 163.66

## 2024-02-29 NOTE — Patient Instructions (Incomplete)
 It was good to see you again today Please be sure to stay up-to-date on your COVID boosters  I will be in touch with your labs asap I think you are due to set up a mammogram at your convenience

## 2024-03-02 ENCOUNTER — Telehealth: Payer: Self-pay | Admitting: Neurology

## 2024-03-02 ENCOUNTER — Ambulatory Visit (INDEPENDENT_AMBULATORY_CARE_PROVIDER_SITE_OTHER): Payer: Self-pay | Admitting: Family Medicine

## 2024-03-02 VITALS — BP 144/76 | HR 98 | Temp 98.0°F | Resp 18 | Ht 63.0 in | Wt 253.2 lb

## 2024-03-02 DIAGNOSIS — Z13220 Encounter for screening for lipoid disorders: Secondary | ICD-10-CM

## 2024-03-02 DIAGNOSIS — R7303 Prediabetes: Secondary | ICD-10-CM

## 2024-03-02 DIAGNOSIS — Z1329 Encounter for screening for other suspected endocrine disorder: Secondary | ICD-10-CM

## 2024-03-02 DIAGNOSIS — E611 Iron deficiency: Secondary | ICD-10-CM

## 2024-03-02 NOTE — Telephone Encounter (Signed)
 Saddie from [employer] called stating that they needed a [employer] request from Provider sent over to them .   [facility] Phone# [phone] Fax# [phone]

## 2024-03-02 NOTE — Telephone Encounter (Signed)
 I called [employer] they need certificate of medical necessity for pts cpap supplies.  I relayed that they need to fax to us  for Dr. Omar Bibber to sign, [phone].  She wil fax to us .

## 2024-03-03 LAB — LIPID PANEL
Cholesterol: 208 mg/dL — ABNORMAL HIGH (ref 0–200)
HDL: 44.5 mg/dL (ref >39.00)
LDL Cholesterol: 125 mg/dL — ABNORMAL HIGH (ref 0–99)
NonHDL: 163.66
Total CHOL/HDL Ratio: 5
Triglycerides: 195 mg/dL — ABNORMAL HIGH (ref 0.0–149.0)
VLDL: 39 mg/dL (ref 0.0–40.0)

## 2024-03-03 LAB — HEMOGLOBIN A1C: Hgb A1c MFr Bld: 6.1 % (ref 4.6–6.5)

## 2024-03-06 NOTE — Telephone Encounter (Signed)
 Received medical necessity for cpap supplies waiting for Dr Omar Bibber to sign form

## 2024-03-07 ENCOUNTER — Telehealth: Payer: BC Managed Care – PPO | Admitting: Adult Health

## 2024-03-07 ENCOUNTER — Telehealth: Payer: Self-pay | Admitting: *Deleted

## 2024-03-07 ENCOUNTER — Encounter: Payer: Self-pay | Admitting: Family Medicine

## 2024-03-07 NOTE — Telephone Encounter (Signed)
 [facility] called to inform us  pt will have to come back to lab for a repeat TSH. Carima from [facility] stated the machines were done and test was not completed. Okay to order TSH and call pt to schedule a follow up lab appointment.

## 2024-03-07 NOTE — Telephone Encounter (Signed)
 Will refax the CMN form to us  again.  Will hold on to it until pt seen.  Faith will reach out to pt about this as well to call for earlier appt.

## 2024-03-08 ENCOUNTER — Encounter: Payer: Self-pay | Admitting: Family Medicine

## 2024-03-08 ENCOUNTER — Other Ambulatory Visit: Payer: Self-pay

## 2024-03-08 DIAGNOSIS — E611 Iron deficiency: Secondary | ICD-10-CM

## 2024-03-08 DIAGNOSIS — Z1329 Encounter for screening for other suspected endocrine disorder: Secondary | ICD-10-CM

## 2024-03-08 NOTE — Addendum Note (Signed)
 Addended by: Marigene Shoulder on: 03/08/2024 02:52 PM   Modules accepted: Orders

## 2024-03-08 NOTE — Telephone Encounter (Signed)
 Pt has lab appointment tomorrow 03/09/2024. Please see mychart message.

## 2024-03-08 NOTE — Telephone Encounter (Signed)
 Lab order has been placed, Mariah Shines from [facility] lab stated she will call pt to schedule lab appointment.

## 2024-03-09 ENCOUNTER — Other Ambulatory Visit

## 2024-03-15 ENCOUNTER — Other Ambulatory Visit (INDEPENDENT_AMBULATORY_CARE_PROVIDER_SITE_OTHER): Payer: Self-pay

## 2024-03-15 DIAGNOSIS — Z1329 Encounter for screening for other suspected endocrine disorder: Secondary | ICD-10-CM

## 2024-03-15 DIAGNOSIS — E611 Iron deficiency: Secondary | ICD-10-CM

## 2024-03-16 LAB — FERRITIN: Ferritin: 367.8 ng/mL — ABNORMAL HIGH (ref 10.0–291.0)

## 2024-03-16 LAB — TSH: TSH: 2.62 u[IU]/mL (ref 0.35–5.50)

## 2024-03-20 ENCOUNTER — Encounter: Payer: Self-pay | Admitting: Family Medicine

## 2024-03-20 DIAGNOSIS — R7989 Other specified abnormal findings of blood chemistry: Secondary | ICD-10-CM

## 2024-03-20 NOTE — Addendum Note (Signed)
 Addended by: Gates Kasal C on: 03/20/2024 03:06 PM   Modules accepted: Orders

## 2024-05-01 ENCOUNTER — Ambulatory Visit (INDEPENDENT_AMBULATORY_CARE_PROVIDER_SITE_OTHER): Admitting: Adult Health

## 2024-05-01 ENCOUNTER — Encounter: Payer: Self-pay | Admitting: Adult Health

## 2024-05-01 ENCOUNTER — Telehealth: Payer: Self-pay | Admitting: *Deleted

## 2024-05-01 VITALS — BP 153/93 | HR 88 | Ht 63.0 in | Wt 249.0 lb

## 2024-05-01 DIAGNOSIS — G4733 Obstructive sleep apnea (adult) (pediatric): Secondary | ICD-10-CM

## 2024-05-01 NOTE — Patient Instructions (Signed)
 Continue using CPAP nightly and greater than 4 hours each night Will reach out to your DME company  verify your settings.  Will order a mask refit If your symptoms worsen or you develop new symptoms please let us  know.

## 2024-05-01 NOTE — Telephone Encounter (Signed)
 Called pt and let her know that we sent order for mask refit to [employer] and also about pressure changes.  She appreciated call back. [place] location is good for her.

## 2024-05-01 NOTE — Progress Notes (Signed)
 PATIENT: Melissa White DOB: 1976-07-22  REASON FOR VISIT: follow up HISTORY FROM: patient PRIMARY NEUROLOGIST: Dr. Buck  Chief Complaint  Patient presents with   Sleep Apnea    LUNAPt aware to bring pap machine and powercord for office visit 7/10 ms lvm Pt notice that she having headaches, x1 month , nasal congestion ,she stated that it feels like sinus , she stated that she sleeps okay      HISTORY OF PRESENT ILLNESS: Today 05/01/24:  Melissa White is a 48 y.o. female with a history of OSA on CPAP. Returns today for follow-up.  She reports that the CPAP is working well although she reports that there are times that she feels nasal congestion and sinus pressure.  This does not happen every day.  She currently has the DreamWear mask.  She states that her pressure was adjusted at the last visit.  According to the download it looks like it is AutoSet 10-13     REVIEW OF SYSTEMS: Out of a complete 14 system review of symptoms, the patient complains only of the following symptoms, and all other reviewed systems are negative.   ESS 4  ALLERGIES: No Known Allergies  HOME MEDICATIONS: Outpatient Medications Prior to Visit  Medication Sig Dispense Refill   BIKTARVY  50-200-25 MG TABS tablet Take 1 tablet by mouth daily.  5   cetirizine  (ZYRTEC ) 10 MG tablet Take 1 tablet (10 mg total) by mouth daily. 30 tablet 0   ferrous sulfate 325 (65 FE) MG tablet Take 325 mg by mouth daily with breakfast.     medroxyPROGESTERone  Acetate (DEPO-PROVERA  IM) Inject into the muscle every 3 (three) months. (Patient not taking: Reported on 03/02/2024)     valACYclovir  (VALTREX ) 500 MG tablet Take 500 mg by mouth daily.     No facility-administered medications prior to visit.    PAST MEDICAL HISTORY: Past Medical History:  Diagnosis Date   Allergy    Anemia    Fibroids    HIV (human immunodeficiency virus infection) (HCC)    Right carpal tunnel syndrome 12/23/2021    PAST SURGICAL  HISTORY: Past Surgical History:  Procedure Laterality Date   CESAREAN SECTION     2   IR RADIOLOGIST EVAL & MGMT  12/21/2018   UTERINE FIBROID EMBOLIZATION  03/11/2021    FAMILY HISTORY: Family History  Problem Relation Age of Onset   Diabetes Mother    Arthritis Mother    Hypertension Mother    Kidney disease Father    Hypertension Father    Sleep apnea Sister     SOCIAL HISTORY: Social History   Socioeconomic History   Marital status: Married    Spouse name: Not on file   Number of children: 3   Years of education: Not on file   Highest education level: Not on file  Occupational History   Not on file  Tobacco Use   Smoking status: Never   Smokeless tobacco: Never  Vaping Use   Vaping status: Never Used  Substance and Sexual Activity   Alcohol use: No   Drug use: No   Sexual activity: Not Currently    Birth control/protection: Injection  Other Topics Concern   Not on file  Social History Narrative   Right handed    Lives in a one story home    Has three sons    Drinks Caffeine  every once in awhile    Social Drivers of Health   Financial Resource Strain: Low Risk  (03/01/2024)  Overall Financial Resource Strain (CARDIA)    Difficulty of Paying Living Expenses: Not hard at all  Food Insecurity: No Food Insecurity (03/01/2024)   Hunger Vital Sign    Worried About Running Out of Food in the Last Year: Never true    Ran Out of Food in the Last Year: Never true  Transportation Needs: No Transportation Needs (03/01/2024)   PRAPARE - Administrator, Civil Service (Medical): No    Lack of Transportation (Non-Medical): No  Physical Activity: Unknown (03/01/2024)   Exercise Vital Sign    Days of Exercise per Week: Patient declined    Minutes of Exercise per Session: Not on file  Stress: No Stress Concern Present (03/01/2024)   [employer] of Occupational Health - Occupational Stress Questionnaire    Feeling of Stress : Only a little  Social  Connections: Unknown (03/01/2024)   Social Connection and Isolation Panel    Frequency of Communication with Friends and Family: More than three times a week    Frequency of Social Gatherings with Friends and Family: More than three times a week    Attends Religious Services: More than 4 times per year    Active Member of Clubs or Organizations: Not on file    Attends Banker Meetings: Not on file    Marital Status: Married  Intimate Partner Violence: Not on file      PHYSICAL EXAM  Vitals:   05/01/24 0934  BP: (!) 153/93  Pulse: 88  Weight: 249 lb (112.9 kg)  Height: 5' 3 (1.6 m)   Body mass index is 44.11 kg/m.  Generalized: Well developed, in no acute distress  Chest: Lungs clear to auscultation bilaterally  Neurological examination  Mentation: Alert oriented to time, place, history taking. Follows all commands speech and language fluent Cranial nerve II-XII: Extraocular movements were full, visual field were full on confrontational test Head turning and shoulder shrug  were normal and symmetric.   DIAGNOSTIC DATA (LABS, IMAGING, TESTING) - I reviewed patient records, labs, notes, testing and imaging myself where available.  Lab Results  Component Value Date   WBC 3.7 (L) 11/17/2021   HGB 13.1 11/17/2021   HCT 39.4 11/17/2021   MCV 82.3 11/17/2021   PLT 253.0 11/17/2021      Component Value Date/Time   NA 139 12/23/2022 1620   K 3.5 12/23/2022 1620   CL 106 12/23/2022 1620   CO2 25 12/23/2022 1620   GLUCOSE 90 12/23/2022 1620   BUN 12 12/23/2022 1620   CREATININE 0.83 12/23/2022 1620   CALCIUM 9.2 12/23/2022 1620   PROT 7.5 12/23/2022 1620   ALBUMIN 3.6 12/23/2022 1620   AST 20 12/23/2022 1620   ALT 19 12/23/2022 1620   ALKPHOS 70 12/23/2022 1620   BILITOT 0.3 12/23/2022 1620   GFRNONAA >60 03/13/2021 0732   GFRAA >60 08/07/2019 1819   Lab Results  Component Value Date   CHOL 208 (H) 03/02/2024   HDL 44.50 03/02/2024   LDLCALC 125 (H)  03/02/2024   TRIG 195.0 (H) 03/02/2024   CHOLHDL 5 03/02/2024   Lab Results  Component Value Date   HGBA1C 6.1 03/02/2024   Lab Results  Component Value Date   VITAMINB12 327 08/23/2020   Lab Results  Component Value Date   TSH 2.62 03/15/2024      ASSESSMENT AND PLAN 48 y.o. year old female  has a past medical history of Allergy, Anemia, Fibroids, HIV (human immunodeficiency virus infection) (HCC), and Right  carpal tunnel syndrome (12/23/2021). here with:  OSA on CPAP  - CPAP compliance excellent - Good treatment of AHI  - Encourage patient to use CPAP nightly and > 4 hours each night - Will change pressure 8-10cm - mask refitting  - F/U in 1 year or sooner if needed    Duwaine Russell, MSN, NP-C 05/01/2024, 9:30 AM [facility] [address], [address] [place], [place] [address] [phone]  The patient's condition requires frequent monitoring and adjustments in the treatment plan, reflecting the ongoing complexity of care.  This provider is the continuing focal point for all needed services for this condition.

## 2024-05-02 NOTE — Telephone Encounter (Signed)
 RE: mask refitting, change pressure to 8-10 Received: Today New, Adine Neysa Nena GORMAN, RN; New, Bradley; Cain, Mitchell; Garcia, Patricia; Ziegler, Melissa; 1 other Received, Thank you!     Previous Messages    ----- Message ----- From: Neysa Nena GORMAN, RN Sent: 05/01/2024   5:21 PM EDT To: Adine Leer; Avelina Sprung; Ephraim Dollar* Subject: mask refitting, change pressure to 8-10        New order in epic  Please let patient know I changed her pressure 8-10 and [facility] 48 [facility] [birth year] Thanks,  [place]

## 2024-09-27 NOTE — Progress Notes (Signed)
 [facility] [address] [place], [place] [address] [phone]  Date:  09/28/2024   Name:  Melissa White   DOB:  23-Dec-1975   MRN:  [id number]  PCP:  Watt Harlene BROCKS, MD    Chief Complaint: No chief complaint on file.   History of Present Illness:  Melissa White is a 48 y.o. very pleasant female patient who presents with the following:  Patient seen virtually today with concern of ear pain and sinus drainage.  I saw her most recently in May of this year History of HIV infection, menorrhagia, uterine fibroids which have caused anemia-currently on Depo, prediabetes, sleep apnea  Her infectious disease care is through [facility] She followed up with them in September, doing well  Virtual visit today.  Patient location is home, my location is office.  Patient identity confirmed with 2 factors, she has consented for virtual visit today.  The patient and myself are present on the call today  Discussed the use of AI scribe software for clinical note transcription with the patient, who gave verbal consent to proceed.  History of Present Illness Melissa White is a 48 year old female who presents with left ear pain and possible inner ear infection.  She has been experiencing a throbbing pain in her left ear for nearly a week, which worsens at night and is not relieved by Tylenol . The pain intensifies when lying down.  She describes a sensation of pressure in the ear, likening it to having something in the ear. There is no drainage, but she experiences pain when coughing. No hearing loss is reported.  She has had a dry cough and sinus pressure last week, which she attributes to her CPAP machine use. No fever, chills, vomiting, or diarrhea are present.  Her blood pressure has been stable, with readings around 135/83 to 140/85. She is at her job at [employer]- she can have someone look in her ear and see if there is  evidence of OE   Patient Active Problem List   Diagnosis Date Noted   Status post carpal tunnel release 02/19/2022   Right carpal tunnel syndrome 12/23/2021   Left carpal tunnel syndrome 12/23/2021   Symptomatic anemia 03/01/2021   Low back pain 08/21/2019   Costochondritis 08/08/2019   HIV positive (HCC) 06/16/2017   Menstrual cramps 06/16/2017    Past Medical History:  Diagnosis Date   Allergy    Anemia    Fibroids    HIV (human immunodeficiency virus infection) (HCC)    Right carpal tunnel syndrome 12/23/2021    Past Surgical History:  Procedure Laterality Date   CESAREAN SECTION     2   HYSTEROTOMY  09/2023   IR RADIOLOGIST EVAL & MGMT  12/21/2018   UTERINE FIBROID EMBOLIZATION  03/11/2021    Social History   Tobacco Use   Smoking status: Never   Smokeless tobacco: Never  Vaping Use   Vaping status: Never Used  Substance Use Topics   Alcohol use: No   Drug use: No    Family History  Problem Relation Age of Onset   Diabetes Mother    Arthritis Mother    Hypertension Mother    Kidney disease Father    Hypertension Father    Sleep apnea Sister     No Known Allergies  Medication list has been reviewed and updated.  Current Outpatient Medications on File Prior to Visit  Medication Sig  Dispense Refill   BIKTARVY  50-200-25 MG TABS tablet Take 1 tablet by mouth daily.  5   cetirizine  (ZYRTEC ) 10 MG tablet Take 1 tablet (10 mg total) by mouth daily. 30 tablet 0   ferrous sulfate 325 (65 FE) MG tablet Take 325 mg by mouth daily with breakfast.     medroxyPROGESTERone  Acetate (DEPO-PROVERA  IM) Inject into the muscle every 3 (three) months. (Patient not taking: Reported on 03/02/2024)     valACYclovir  (VALTREX ) 500 MG tablet Take 500 mg by mouth daily.     No current facility-administered medications on file prior to visit.    Review of Systems:  As per HPI- otherwise negative.   Physical Examination: There were no vitals filed for this visit. There  were no vitals filed for this visit. There is no height or weight on file to calculate BMI. Ideal Body Weight:   Pt observed via mychart video- she looks well, her normal self She notes her BP has been under good control- about 135-140/ 83- 85  Assessment and Plan: Acute otitis media, unspecified otitis media type - Plan: amoxicillin  (AMOXIL ) 875 MG tablet  Assessment & Plan Suspect acute otitis media, left ear Acute otitis media with pain and pressure, no fever or drainage. - Prescribed amoxicillin  twice daily for 10 days. - Advised ibuprofen  or acetaminophen  for pain. - Instructed to seek urgent care if symptoms worsen, indicating possible eardrum rupture. - Advised follow-up if no improvement by Monday. - Instructed to notify if otitis externa is diagnosed requiring ear drops.  Signed Harlene Schroeder, MD

## 2024-09-28 ENCOUNTER — Telehealth: Payer: Self-pay | Admitting: Family Medicine

## 2024-09-28 ENCOUNTER — Encounter: Payer: Self-pay | Admitting: Family Medicine

## 2024-09-28 DIAGNOSIS — H60502 Unspecified acute noninfective otitis externa, left ear: Secondary | ICD-10-CM

## 2024-09-28 DIAGNOSIS — H6690 Otitis media, unspecified, unspecified ear: Secondary | ICD-10-CM

## 2024-09-28 MED ORDER — OFLOXACIN 0.3 % OT SOLN: 10.0000 [drp] | Freq: Every day | OTIC | 0 refills | Status: AC

## 2024-09-28 MED ORDER — AMOXICILLIN 875 MG PO TABS: 875.0000 mg | ORAL_TABLET | Freq: Two times a day (BID) | ORAL | 0 refills | Status: AC
